# Patient Record
Sex: Female | Born: 1945 | Race: White | Hispanic: No | Marital: Married | Smoking: Former smoker
Health system: Southern US, Community
[De-identification: ages and names within clinical notes are randomized; demographics above are authoritative.]

## PROBLEM LIST (undated history)

## (undated) DIAGNOSIS — E119 Type 2 diabetes mellitus without complications: Secondary | ICD-10-CM

## (undated) DIAGNOSIS — L509 Urticaria, unspecified: Secondary | ICD-10-CM

## (undated) HISTORY — PX: ADENOIDECTOMY: SUR15

## (undated) HISTORY — PX: OTHER SURGICAL HISTORY: SHX169

## (undated) HISTORY — DX: Type 2 diabetes mellitus without complications: E11.9

## (undated) HISTORY — PX: CATARACT EXTRACTION, BILATERAL: SHX1313

## (undated) HISTORY — PX: GALLBLADDER SURGERY: SHX652

## (undated) HISTORY — DX: Urticaria, unspecified: L50.9

## (undated) HISTORY — PX: CARPAL TUNNEL RELEASE: SHX101

---

## 2008-07-12 ENCOUNTER — Ambulatory Visit (HOSPITAL_BASED_OUTPATIENT_CLINIC_OR_DEPARTMENT_OTHER): Admission: RE | Admit: 2008-07-12 | Discharge: 2008-07-12 | Payer: Self-pay | Admitting: Family Medicine

## 2008-07-12 ENCOUNTER — Ambulatory Visit: Payer: Self-pay | Admitting: Diagnostic Radiology

## 2008-10-10 ENCOUNTER — Ambulatory Visit: Payer: Self-pay | Admitting: Diagnostic Radiology

## 2008-10-10 ENCOUNTER — Ambulatory Visit (HOSPITAL_BASED_OUTPATIENT_CLINIC_OR_DEPARTMENT_OTHER): Admission: RE | Admit: 2008-10-10 | Discharge: 2008-10-10 | Payer: Self-pay | Admitting: Family Medicine

## 2010-02-24 ENCOUNTER — Emergency Department (HOSPITAL_COMMUNITY): Admission: EM | Admit: 2010-02-24 | Discharge: 2010-02-24 | Payer: Self-pay | Admitting: Family Medicine

## 2012-02-13 IMAGING — CR DG THORACIC SPINE 2V
3 series · 3 of 3 positions shown · non-contrast
Comparison: Chest radiograph on 10/10/2008

CLINICAL DATA: Thoracic back pain.

THORACIC SPINE - 2 VIEW

[view not recorded (1 of 3)]
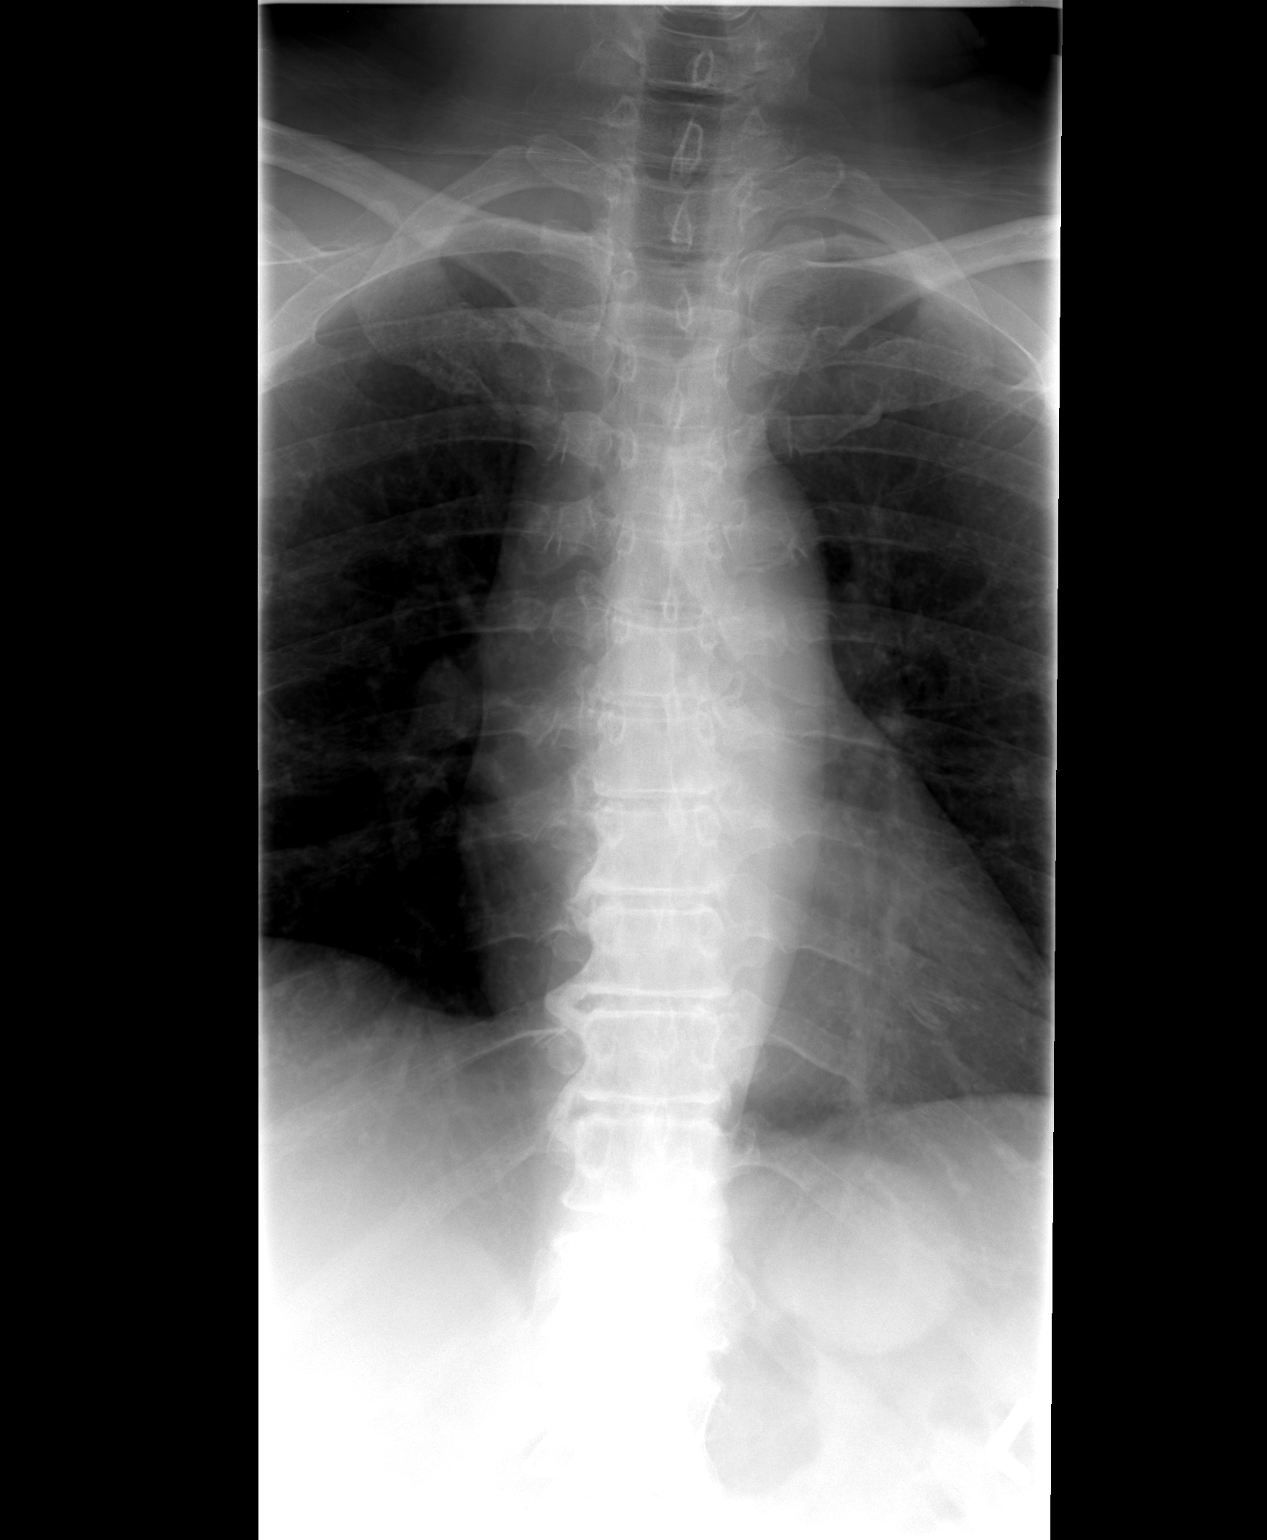

[view not recorded (2 of 3)]
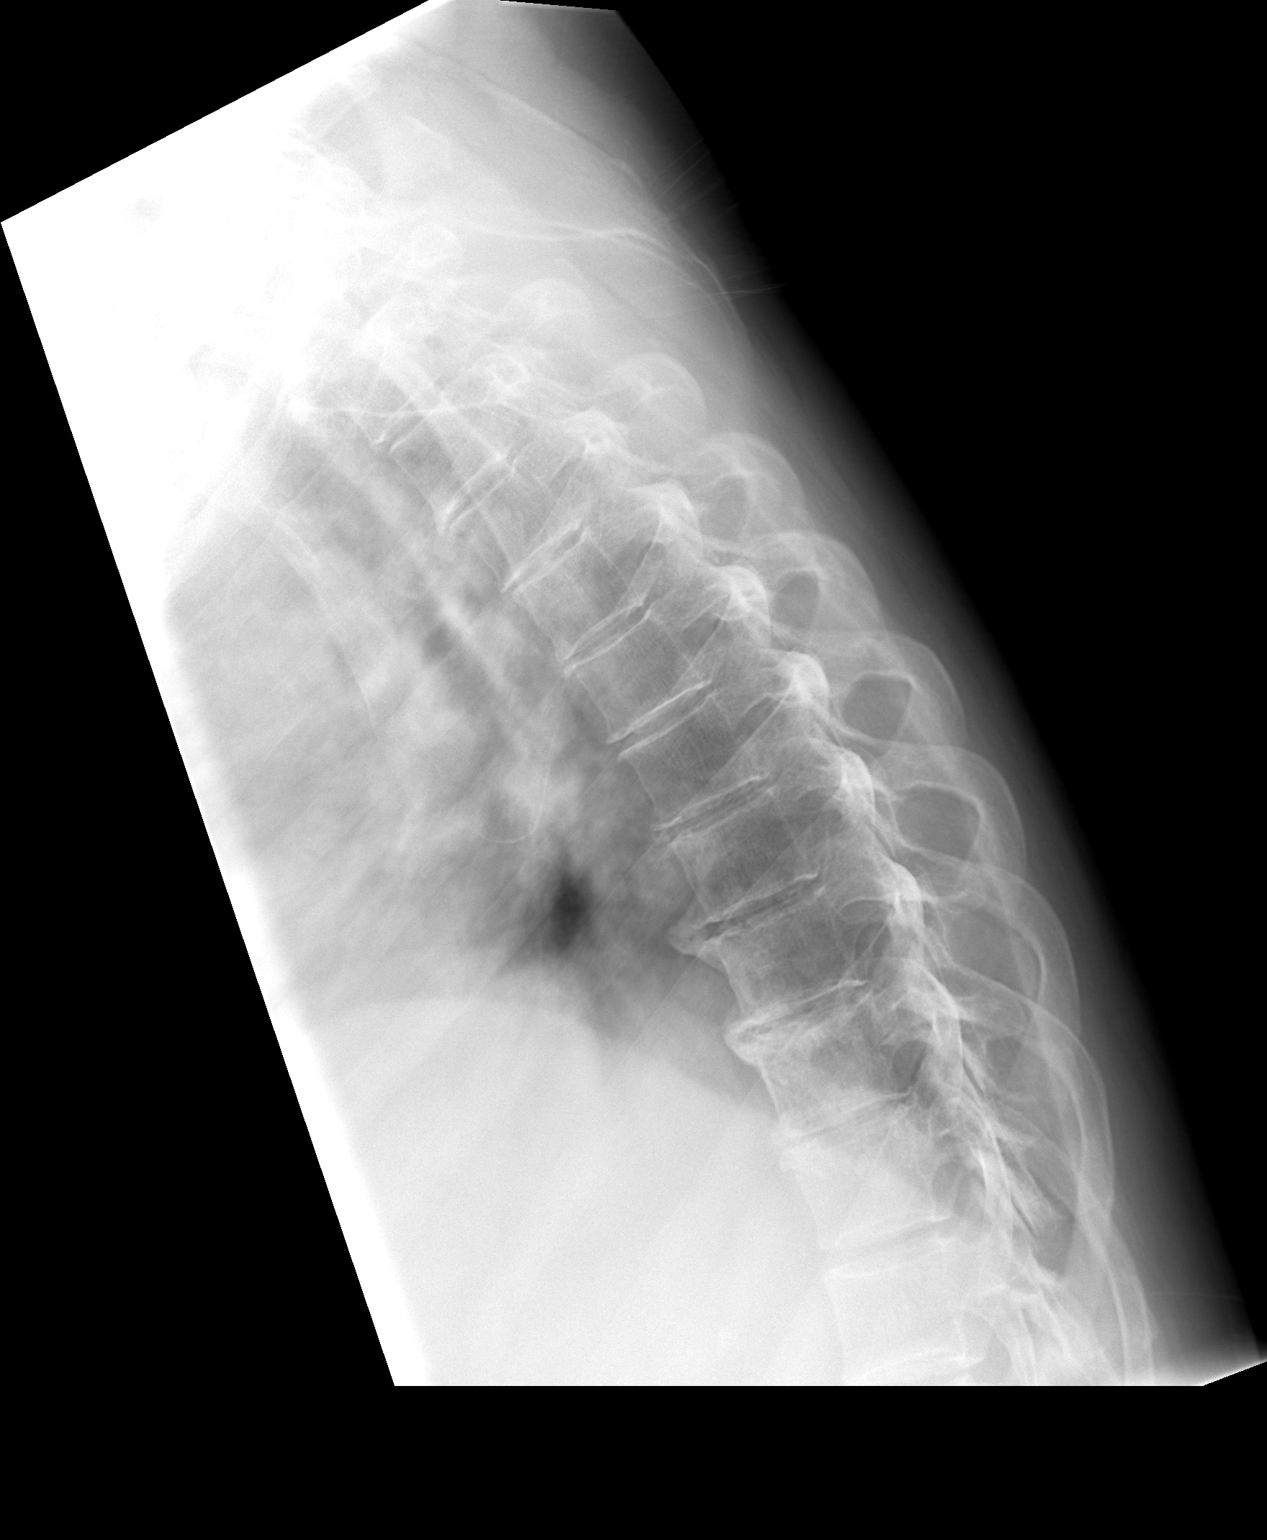

[view not recorded (3 of 3)]
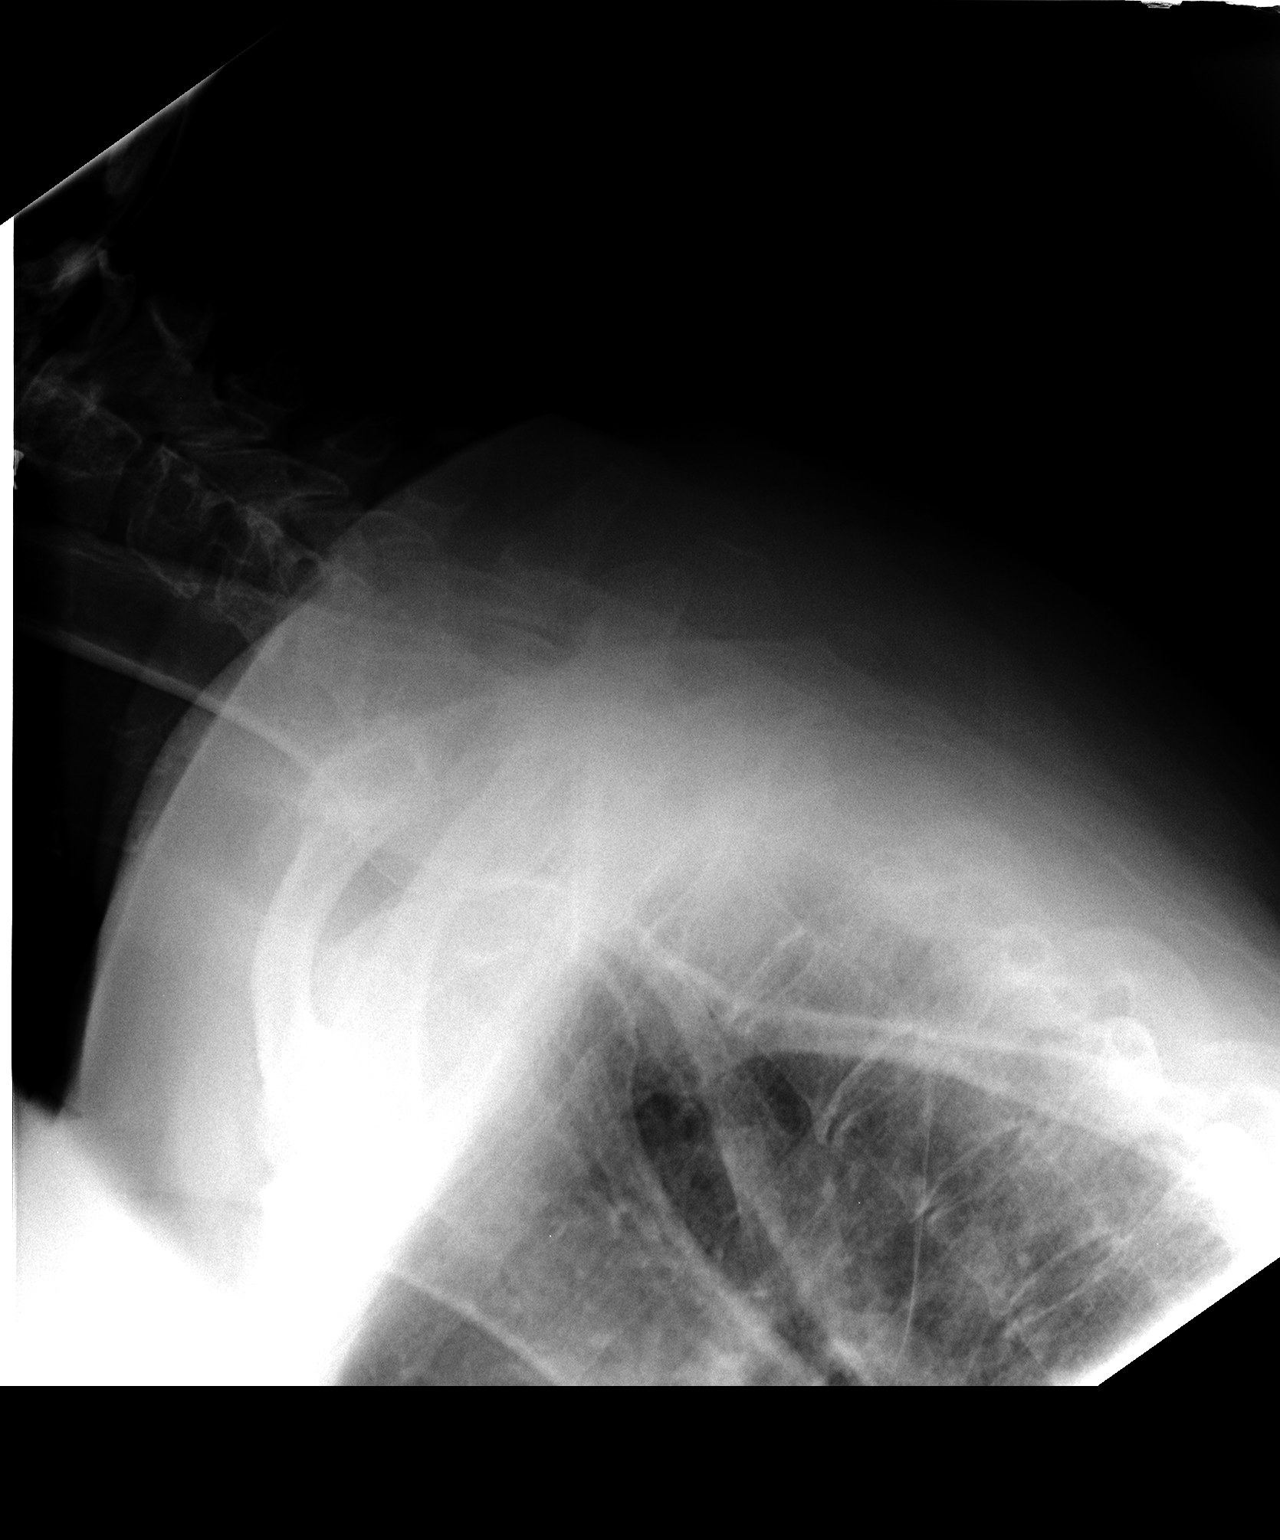

[3 of 3 positions shown; findings below may reference images not displayed]

FINDINGS: No evidence of thoracic spine fracture or subluxation.
Degenerative vertebral endplate osteophyte formation is seen at
several levels in the upper and lower thoracic spine.  No
significant disc space narrowing or endplate destruction.  No focal
lytic or sclerotic bone lesions.
IMPRESSION: 1.  No acute findings.
2.  Mild thoracic degenerative spondylosis.

## 2016-10-08 ENCOUNTER — Encounter: Payer: Self-pay | Admitting: Allergy and Immunology

## 2016-10-08 ENCOUNTER — Ambulatory Visit (INDEPENDENT_AMBULATORY_CARE_PROVIDER_SITE_OTHER): Payer: Medicare Other | Admitting: Allergy and Immunology

## 2016-10-08 VITALS — BP 138/72 | Ht 62.5 in | Wt 178.8 lb

## 2016-10-08 DIAGNOSIS — J3089 Other allergic rhinitis: Secondary | ICD-10-CM | POA: Insufficient documentation

## 2016-10-08 DIAGNOSIS — T7840XA Allergy, unspecified, initial encounter: Secondary | ICD-10-CM

## 2016-10-08 DIAGNOSIS — J310 Chronic rhinitis: Secondary | ICD-10-CM

## 2016-10-08 DIAGNOSIS — J4530 Mild persistent asthma, uncomplicated: Secondary | ICD-10-CM | POA: Diagnosis not present

## 2016-10-08 DIAGNOSIS — L502 Urticaria due to cold and heat: Secondary | ICD-10-CM | POA: Diagnosis not present

## 2016-10-08 MED ORDER — EPINEPHRINE 0.3 MG/0.3ML IJ SOAJ: 0.3000 mg | Freq: Once | INTRAMUSCULAR | 1 refills | Status: AC

## 2016-10-08 NOTE — Progress Notes (Signed)
New Patient Note  RE:  C[place]ack MRN: [id number] DOB: 03/18/1946 Date of Office Visit: 10/08/2016  Referring provider: Christ Kick, PA Primary care provider: Christ Kick, PA  Chief Complaint: Urticaria and Nasal Congestion   History of present illness: Martha Young is a 71 y.o. female seen today in consultation requested by Yevonne Pax, PA.  Starting this past fall, the patient has developed red, itching, burning skin and exposed area when the weather is cool or cold.  On occasion she has noticed hives in these exposed areas.  The symptoms resolved with rewarming.  She is unable to hold a cold beverage in her hand without the palm of her hand becoming red and pruritic.  She does not experience concomitant angioedema, cardiopulmonary symptoms, or GI symptoms.  No other food or environmental triggers have been identified.  Martha Young also complains of persistent rhinorrhea, nasal congestion, postnasal drainage, and occasional sinus pressure.   No significant seasonal symptom variation has been noted nor have specific environmental triggers been identified.She does not seem to experience relief from cetirizine or levocetirizine.  She takes montelukast and albuterol as needed for coughing or wheezing.   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  To rule out etiologies underlying secondary acquired cold urticaria or autoimmune involvement, the following labs have been ordered: CBC with differential, CMP, LFTs, tryptase, thyroglobulin antibody, thyroid peroxidase antibody, chronic urticaria index panel.  Take levocetirizine 5 mg before cold exposure.  Take medications as prescribed.  Protect your skin from the cold or sudden changes in temperature. If you're going swimming, dip your hand in the water first and see if you experience a skin reaction.  Avoid ice-cold drinks and food  to prevent swelling of your lips or throat.  Keep an epinephrine autoinjector with you to help prevent serious reactions.  An epinephrine autoinjector 0.3 mg 2 pack has been prescribed and instructions have been provided for appropriate administration.  If you're scheduled for surgery, talk with your surgeon beforehand about your cold urticaria. The surgical team can take steps to help prevent cold-induced symptoms in the operating room.  Chronic rhinitis We were unable to perform skin tests today due to recent administration of antihistamine.   The patient is scheduled to return in the near future for allergy skin testing after having been off of antihistamines for at least 3 days.  Further recommendations will be made at that time based upon skin test results.  For now, I recommended more consistent use of fluticasone nasal spray.  Mild persistent asthma  Continue montelukast 10 mg daily at bedtime and albuterol HFA, 1-2 inhalations every 4-6 hours as needed.  Subjective and objective measures of pulmonary function will be followed and the treatment plan will be adjusted accordingly.   Meds ordered this encounter  Medications  . EPINEPHrine 0.3 mg/0.3 mL IJ SOAJ injection    Sig: Inject 0.3 mLs (0.3 mg total) into the muscle once.    Dispense:  2 Device    Refill:  1    DISPENSE MYLAN GENERIC ONLY    Diagnostics: Spirometry:  Normal with an FEV1 of 2.37 L.  Please see scanned spirometry results for details. Ice cube test: Positive.    Physical examination: Blood pressure 138/72, height 5' 2.5" (1.588 m), weight 178 lb 12.8 oz (81.1 kg).  General: Alert, interactive, in no acute distress. HEENT: TMs pearly gray, turbinates moderately edematous  with clear discharge, post-pharynx mildly erythematous. Neck: Supple without lymphadenopathy. Lungs: Clear to auscultation without wheezing, rhonchi or rales. CV: Normal S1, S2 without murmurs. Abdomen: Nondistended,  nontender. Skin: Warm and dry, without lesions or rashes. Extremities:  No clubbing, cyanosis or edema. Neuro:   Grossly intact.  Review of systems:  Review of systems negative except as noted in HPI / PMHx or noted below: Review of Systems  Constitutional: Negative.   HENT: Negative.   Eyes: Negative.   Respiratory: Negative.   Cardiovascular: Negative.   Gastrointestinal: Negative.   Genitourinary: Negative.   Musculoskeletal: Negative.   Skin: Negative.   Neurological: Negative.   Endo/Heme/Allergies: Negative.   Psychiatric/Behavioral: Negative.     Past medical history:  Past Medical History:  Diagnosis Date  . Diabetes (HCC)   . Urticaria     Past surgical history:  Past Surgical History:  Procedure Laterality Date  . ADENOIDECTOMY    . CARPAL TUNNEL RELEASE Right   . CATARACT EXTRACTION, BILATERAL    . GALLBLADDER SURGERY    . tubaligation      Family history: Family History  Problem Relation Age of Onset  . Allergic rhinitis Sister   . Angioedema Neg Hx   . Asthma Neg Hx   . Eczema Neg Hx   . Immunodeficiency Neg Hx   . Urticaria Neg Hx     Social history: Social History   Social History  . Marital status: Married    Spouse name: N/A  . Number of children: N/A  . Years of education: N/A   Occupational History  . Not on file.   Social History Main Topics  . Smoking status: Former Smoker    Years: 34.00  . Smokeless tobacco: Never Used  . Alcohol use Yes  . Drug use: No  . Sexual activity: Not on file   Other Topics Concern  . Not on file   Social History Narrative  . No narrative on file   Environmental History: The patient lives in a 69 year old house with carpeting throughout and central air/heat.  There are 5 cats and one dog in the home, the cats have access to her bedroom.  She is a former cigarette smoker having started smoking in 1967 and quit in 2001 with an average of 1.5 packs of cigarettes per day  Allergies as of  10/08/2016      Reactions   Aspirin Anaphylaxis   Passes out every time she takes it    Nsaids       Medication List       Accurate as of 10/08/16  3:19 PM. Always use your most recent med list.          albuterol 108 (90 Base) MCG/ACT inhaler Commonly known as:  PROVENTIL HFA;VENTOLIN HFA Inhale 2 puffs into the lungs every 6 (six) hours as needed.   calcium-vitamin D 500-200 MG-UNIT Tabs tablet Commonly known as:  OSCAL WITH D Take 1 tablet by mouth daily.   EPINEPHrine 0.3 mg/0.3 mL Soaj injection Commonly known as:  EPI-PEN Inject 0.3 mLs (0.3 mg total) into the muscle once.   FLUoxetine 20 MG capsule Commonly known as:  PROZAC Take 20 mg by mouth daily.   fluticasone 50 MCG/ACT nasal spray Commonly known as:  FLONASE Place 2 sprays into the nose daily.   glipiZIDE 5 MG 24 hr tablet Commonly known as:  GLUCOTROL XL Take 10 mg by mouth daily.   ibandronate 150 MG tablet Commonly known as:  BONIVA Take 150 mg by mouth daily.   levocetirizine 5 MG tablet Commonly known as:  XYZAL Take 5 mg by mouth every evening.   lisinopril 10 MG tablet Commonly known as:  PRINIVIL,ZESTRIL Take 10 mg by mouth daily.   Melatonin 10 MG Caps Take 10 mg by mouth daily.   metFORMIN 500 MG 24 hr tablet Commonly known as:  GLUCOPHAGE-XR Take 2,000 mg by mouth daily.   montelukast 10 MG tablet Commonly known as:  SINGULAIR Take 10 mg by mouth daily.   pravastatin 20 MG tablet Commonly known as:  PRAVACHOL Take 20 mg by mouth daily.   THERA Tabs Take 1 tablet by mouth daily.   Vitamin D3 1000 units Caps Take by mouth.       Known medication allergies: Allergies  Allergen Reactions  . Aspirin Anaphylaxis    Passes out every time she takes it   . Nsaids     I appreciate the opportunity to take part in [facility] care. Please do not hesitate to contact me with questions.  Sincerely,   R. Jorene Guest, MD

## 2016-10-08 NOTE — Assessment & Plan Note (Signed)
The patient's history suggests cold urticaria and positive ice cube test confirms this diagnosis.  To rule out etiologies underlying secondary acquired cold urticaria or autoimmune involvement, the following labs have been ordered: CBC with differential, CMP, LFTs, tryptase, thyroglobulin antibody, thyroid peroxidase antibody, chronic urticaria index panel.  Take levocetirizine 5 mg before cold exposure.  Take medications as prescribed.  Protect your skin from the cold or sudden changes in temperature. If you're going swimming, dip your hand in the water first and see if you experience a skin reaction.  Avoid ice-cold drinks and food to prevent swelling of your lips or throat.  Keep an epinephrine autoinjector with you to help prevent serious reactions.  An epinephrine autoinjector 0.3 mg 2 pack has been prescribed and instructions have been provided for appropriate administration.  If you're scheduled for surgery, talk with your surgeon beforehand about your cold urticaria. The surgical team can take steps to help prevent cold-induced symptoms in the operating room.

## 2016-10-08 NOTE — Patient Instructions (Addendum)
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  To rule out etiologies underlying secondary acquired cold urticaria or autoimmune involvement, the following labs have been ordered: CBC with differential, CMP, LFTs, tryptase, thyroglobulin antibody, thyroid peroxidase antibody, chronic urticaria index panel.  Take levocetirizine 5 mg before cold exposure.  Take medications as prescribed.  Protect your skin from the cold or sudden changes in temperature. If you're going swimming, dip your hand in the water first and see if you experience a skin reaction.  Avoid ice-cold drinks and food to prevent swelling of your lips or throat.  Keep an epinephrine autoinjector with you to help prevent serious reactions.  An epinephrine autoinjector 0.3 mg 2 pack has been prescribed and instructions have been provided for appropriate administration.  If you're scheduled for surgery, talk with your surgeon beforehand about your cold urticaria. The surgical team can take steps to help prevent cold-induced symptoms in the operating room.  Chronic rhinitis We were unable to perform skin tests today due to recent administration of antihistamine.   The patient is scheduled to return in the near future for allergy skin testing after having been off of antihistamines for at least 3 days.  Further recommendations will be made at that time based upon skin test results.  For now, I recommended more consistent use of fluticasone nasal spray.  Mild persistent asthma  Continue montelukast 10 mg daily at bedtime and albuterol HFA, 1-2 inhalations every 4-6 hours as needed.  Subjective and objective measures of pulmonary function will be followed and the treatment plan will be adjusted accordingly.   Return for allergy skin testing.   The following tips may help prevent a recurrent episode of cold urticaria:   Take an over-the-counter  antihistamine before cold exposure.  Take medications as prescribed.  Protect your skin from the cold or sudden changes in temperature. If you're going swimming, dip your hand in the water first and see if you experience a skin reaction.  Avoid ice-cold drinks and food to prevent swelling of your lips or throat.  Keep an epinephrine autoinjector (EpiPen, Auvi-Q, others) with you to help prevent serious reactions.  If you're scheduled for surgery, talk with your surgeon beforehand about your cold urticaria. The surgical team can take steps to help prevent cold-induced symptoms in the operating room.  By [facility] Staff

## 2016-10-08 NOTE — Assessment & Plan Note (Signed)
   Continue montelukast 10 mg daily at bedtime and albuterol HFA, 1-2 inhalations every 4-6 hours as needed.  Subjective and objective measures of pulmonary function will be followed and the treatment plan will be adjusted accordingly. 

## 2016-10-08 NOTE — Assessment & Plan Note (Addendum)
We were unable to perform skin tests today due to recent administration of antihistamine.   The patient is scheduled to return in the near future for allergy skin testing after having been off of antihistamines for at least 3 days.  Further recommendations will be made at that time based upon skin test results.  For now, I recommended more consistent use of fluticasone nasal spray.

## 2016-10-16 LAB — CBC WITH DIFFERENTIAL/PLATELET
Basophils Absolute: 0 10*3/uL (ref 0.0–0.2)
Basos: 0 %
EOS (ABSOLUTE): 0.1 10*3/uL (ref 0.0–0.4)
Eos: 2 %
Hematocrit: 39.5 % (ref 34.0–46.6)
Hemoglobin: 13.2 g/dL (ref 11.1–15.9)
Immature Grans (Abs): 0 10*3/uL (ref 0.0–0.1)
Immature Granulocytes: 0 %
Lymphocytes Absolute: 3.9 10*3/uL — ABNORMAL HIGH (ref 0.7–3.1)
Lymphs: 51 %
MCH: 30.9 pg (ref 26.6–33.0)
MCHC: 33.4 g/dL (ref 31.5–35.7)
MCV: 93 fL (ref 79–97)
Monocytes Absolute: 0.4 10*3/uL (ref 0.1–0.9)
Monocytes: 5 %
Neutrophils Absolute: 3.2 10*3/uL (ref 1.4–7.0)
Neutrophils: 42 %
Platelets: 151 10*3/uL (ref 150–379)
RBC: 4.27 x10E6/uL (ref 3.77–5.28)
RDW: 13.2 % (ref 12.3–15.4)
WBC: 7.6 10*3/uL (ref 3.4–10.8)

## 2016-10-16 LAB — CHRONIC URTICARIA: cu index: <1 (ref <10)

## 2016-10-16 LAB — TRYPTASE: Tryptase: 8.1 ug/L (ref 2.2–13.2)

## 2016-10-16 NOTE — Addendum Note (Signed)
Addended by: Mliss Fritz I on: 10/16/2016 09:56 AM   Modules accepted: Orders

## 2016-10-17 LAB — THYROGLOBULIN ANTIBODY: Thyroglobulin Antibody: <1 IU/mL (ref 0.0–0.9)

## 2016-10-17 LAB — SPECIMEN STATUS REPORT

## 2016-10-17 LAB — THYROID PEROXIDASE ANTIBODY: Thyroperoxidase Ab SerPl-aCnc: 14 IU/mL (ref 0–34)

## 2016-11-12 ENCOUNTER — Ambulatory Visit: Payer: Medicare Other | Admitting: Allergy and Immunology

## 2016-11-28 ENCOUNTER — Telehealth: Payer: Self-pay | Admitting: Allergy and Immunology

## 2016-11-28 NOTE — Telephone Encounter (Signed)
[facility] for Dr. Nunzio CobbsBobbitt to review.

## 2016-11-28 NOTE — Telephone Encounter (Signed)
Labs are being faxed. Dr. Nunzio CobbsBobbitt will have to be reviewed.

## 2016-11-28 NOTE — Telephone Encounter (Signed)
Patient called and said she was seen a month ago and had labs run and has not heard anything back yet.

## 2016-12-16 ENCOUNTER — Ambulatory Visit (INDEPENDENT_AMBULATORY_CARE_PROVIDER_SITE_OTHER): Payer: Medicare Other | Admitting: Allergy and Immunology

## 2016-12-16 ENCOUNTER — Encounter: Payer: Self-pay | Admitting: Allergy and Immunology

## 2016-12-16 VITALS — BP 122/72 | HR 72 | Resp 16

## 2016-12-16 DIAGNOSIS — L502 Urticaria due to cold and heat: Secondary | ICD-10-CM | POA: Diagnosis not present

## 2016-12-16 DIAGNOSIS — J4530 Mild persistent asthma, uncomplicated: Secondary | ICD-10-CM

## 2016-12-16 DIAGNOSIS — J3089 Other allergic rhinitis: Secondary | ICD-10-CM | POA: Diagnosis not present

## 2016-12-16 NOTE — Assessment & Plan Note (Signed)
   Aeroallergen avoidance measures have been discussed and provided in written form.  Continue fluticasone nasal spray, 1-2 sprays per nostril daily as needed.  I have also recommended nasal saline spray (i.e., Simply Saline) or nasal saline lavage (i.e., NeilMed) as needed and prior to medicated nasal sprays.  Consider switching to fexofenadine if benefit from levocetirizine seems to diminish with daily use.

## 2016-12-16 NOTE — Assessment & Plan Note (Signed)
   Take Allegra 180 mg before cold exposure.  If antihistamines are required on a daily basis, after 2 or 3 months switch to levocetirizine (Xyzal) and rotate these antihistamines every couple months.  Protect your skin from the cold or sudden changes in temperature. If you're going swimming, dip your hand in the water first and see if you experience a skin reaction.  Avoid ice-cold drinks and food to prevent swelling of your lips or throat.  Keep an epinephrine autoinjector with you to help prevent serious reactions.  An epinephrine autoinjector 0.3 mg 2 pack has been prescribed and instructions have been provided for appropriate administration.  If you're scheduled for surgery, talk with your surgeon beforehand about your cold urticaria. The surgical team can take steps to help prevent cold-induced symptoms in the operating room.

## 2016-12-16 NOTE — Progress Notes (Signed)
Follow-up Note  RE: Martha Young MRN: [id number] DOB: Jun 07, 1946 Date of Office Visit: 12/16/2016  Primary care provider: Christ Kick, PA Referring provider: Christ Kick, PA  History of present illness: Martha Young is a 71 y.o. female presenting today for allergy skin testing.  She is previously seen in this clinic for her initial evaluation on 10/08/2016.  Were unable to proceed with allergy skin testing at that time due to recent administration of antihistamine.  She has been off for antihistamine for the past week in anticipation of today's testing and has noticed increased redness, itching of her skin with pressure and with cold exposure.  In addition, she experiences persistent nasal allergy symptoms.  These symptoms occur year around, however she believes that become more frequent and severe when her [place] pear tree blooms in the springtime.  She has no asthma related complaints today.   Assessment and plan: Perennial allergic rhinitis  Aeroallergen avoidance measures have been discussed and provided in written form.  Continue fluticasone nasal spray, 1-2 sprays per nostril daily as needed.  I have also recommended nasal saline spray (i.e., Simply Saline) or nasal saline lavage (i.e., NeilMed) as needed and prior to medicated nasal sprays.  Consider switching to fexofenadine if benefit from levocetirizine seems to diminish with daily use.  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  If antihistamines are required on a daily basis, after 2 or 3 months switch to levocetirizine (Xyzal) and rotate these antihistamines every couple months.  Protect your skin from the cold or sudden changes in temperature. If you're going swimming, dip your hand in the water first and see if you experience a skin reaction.  Avoid ice-cold drinks and food to prevent swelling of your lips or throat.  Keep an epinephrine autoinjector with you to help prevent serious  reactions.  An epinephrine autoinjector 0.3 mg 2 pack has been prescribed and instructions have been provided for appropriate administration.  If you're scheduled for surgery, talk with your surgeon beforehand about your cold urticaria. The surgical team can take steps to help prevent cold-induced symptoms in the operating room.  Mild persistent asthma  Continue montelukast 10 mg daily at bedtime and albuterol HFA, 1-2 inhalations every 4-6 hours as needed.   Diagnostics: Spirometry:  Normal with an FEV1 of 109% predicted.  Please see scanned spirometry results for details.    Physical examination: Blood pressure 122/72, pulse 72, resp. rate 16.  General: Alert, interactive, in no acute distress. HEENT: TMs pearly gray, turbinates mildly edematous without discharge, post-pharynx mildly erythematous. Neck: Supple without lymphadenopathy. Lungs: Clear to auscultation without wheezing, rhonchi or rales. CV: Normal S1, S2 without murmurs. Skin: Warm and dry, without lesions or rashes.  The following portions of the patient's history were reviewed and updated as appropriate: allergies, current medications, past family history, past medical history, past social history, past surgical history and problem list.  Allergies as of 12/16/2016      Reactions   Aspirin Anaphylaxis   Passes out every time she takes it    Nsaids       Medication List       Accurate as of 12/16/16 12:56 PM. Always use your most recent med list.          albuterol 108 (90 Base) MCG/ACT inhaler Commonly known as:  PROVENTIL HFA;VENTOLIN HFA Inhale 2 puffs into the lungs every 6 (six) hours as needed.   calcium-vitamin D 500-200 MG-UNIT Tabs  tablet Commonly known as:  OSCAL WITH D Take 1 tablet by mouth daily.   FLUoxetine 20 MG capsule Commonly known as:  PROZAC Take 20 mg by mouth daily.   fluticasone 50 MCG/ACT nasal spray Commonly known as:  FLONASE Place 2 sprays into the nose daily.     glipiZIDE 5 MG 24 hr tablet Commonly known as:  GLUCOTROL XL Take 10 mg by mouth daily.   ibandronate 150 MG tablet Commonly known as:  BONIVA Take 150 mg by mouth daily.   levocetirizine 5 MG tablet Commonly known as:  XYZAL Take 5 mg by mouth every evening.   lisinopril 10 MG tablet Commonly known as:  PRINIVIL,ZESTRIL Take 10 mg by mouth daily.   Melatonin 10 MG Caps Take 10 mg by mouth daily.   metFORMIN 500 MG 24 hr tablet Commonly known as:  GLUCOPHAGE-XR Take 2,000 mg by mouth daily.   montelukast 10 MG tablet Commonly known as:  SINGULAIR Take 10 mg by mouth daily.   pravastatin 20 MG tablet Commonly known as:  PRAVACHOL Take 20 mg by mouth daily.   THERA Tabs Take 1 tablet by mouth daily.   Vitamin D3 1000 units Caps Take by mouth.       Allergies  Allergen Reactions  . Aspirin Anaphylaxis    Passes out every time she takes it   . Nsaids    Review of systems: Review of systems negative except as noted in HPI / PMHx or noted below: Constitutional: Negative.  HENT: Negative.   Eyes: Negative.  Respiratory: Negative.   Cardiovascular: Negative.  Gastrointestinal: Negative.  Genitourinary: Negative.  Musculoskeletal: Negative.  Neurological: Negative.  Endo/Heme/Allergies: Negative.  Cutaneous: Negative.  Past Medical History:  Diagnosis Date  . Diabetes (HCC)   . Urticaria     Family History  Problem Relation Age of Onset  . Allergic rhinitis Sister   . Angioedema Neg Hx   . Asthma Neg Hx   . Eczema Neg Hx   . Immunodeficiency Neg Hx   . Urticaria Neg Hx     Social History   Social History  . Marital status: Married    Spouse name: N/A  . Number of children: N/A  . Years of education: N/A   Occupational History  . Not on file.   Social History Main Topics  . Smoking status: Former Smoker    Years: 34.00  . Smokeless tobacco: Never Used  . Alcohol use Yes  . Drug use: No  . Sexual activity: Not on file   Other  Topics Concern  . Not on file   Social History Narrative  . No narrative on file    [facility]. Please do not hesitate to contact me with questions.  Sincerely,   R. Jorene Guestarter Darneisha Windhorst, MD

## 2016-12-16 NOTE — Assessment & Plan Note (Signed)
   Continue montelukast 10 mg daily at bedtime and albuterol HFA, 1-2 inhalations every 4-6 hours as needed.

## 2016-12-16 NOTE — Patient Instructions (Addendum)
Perennial allergic rhinitis  Aeroallergen avoidance measures have been discussed and provided in written form.  Continue fluticasone nasal spray, 1-2 sprays per nostril daily as needed.  I have also recommended nasal saline spray (i.e., Simply Saline) or nasal saline lavage (i.e., NeilMed) as needed and prior to medicated nasal sprays.  Consider switching to fexofenadine if benefit from levocetirizine seems to diminish with daily use.  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  If antihistamines are required on a daily basis, after 2 or 3 months switch to levocetirizine (Xyzal) and rotate these antihistamines every couple months.  Protect your skin from the cold or sudden changes in temperature. If you're going swimming, dip your hand in the water first and see if you experience a skin reaction.  Avoid ice-cold drinks and food to prevent swelling of your lips or throat.  Keep an epinephrine autoinjector with you to help prevent serious reactions.  An epinephrine autoinjector 0.3 mg 2 pack has been prescribed and instructions have been provided for appropriate administration.  If you're scheduled for surgery, talk with your surgeon beforehand about your cold urticaria. The surgical team can take steps to help prevent cold-induced symptoms in the operating room.  Mild persistent asthma  Continue montelukast 10 mg daily at bedtime and albuterol HFA, 1-2 inhalations every 4-6 hours as needed.   Return in about 1 year (around 12/16/2017), or if symptoms worsen or fail to improve.  Control of Mold Allergen  Mold and fungi can grow on a variety of surfaces provided certain temperature and moisture conditions exist.  Outdoor molds grow on plants, decaying vegetation and soil.  The major outdoor mold, Alternaria and Cladosporium, are found in very high numbers during hot and dry conditions.  Generally, a late Summer - Fall peak is seen for common outdoor fungal spores.   Rain will temporarily lower outdoor mold spore count, but counts rise rapidly when the rainy period ends.  The most important indoor molds are Aspergillus and Penicillium.  Dark, humid and poorly ventilated basements are ideal sites for mold growth.  The next most common sites of mold growth are the bathroom and the kitchen.  Outdoor MicrosoftMold Control 1. Use air conditioning and keep windows closed 2. Avoid exposure to decaying vegetation. 3. Avoid leaf raking. 4. Avoid grain handling. 5. Consider wearing a face mask if working in moldy areas.  Indoor Mold Control 1. Maintain humidity below 50%. 2. Clean washable surfaces with 5% bleach solution. 3. Remove sources e.g. Contaminated carpets.  Control of Cockroach Allergen  Cockroach allergen has been identified as an important cause of acute attacks of asthma, especially in urban settings.  There are fifty-five species of cockroach that exist in the [place], however only three, the [place], [place] and Oriental species produce allergen that can affect patients with Asthma.  Allergens can be obtained from fecal particles, egg casings and secretions from cockroaches.    1. Remove food sources. 2. Reduce access to water. 3. Seal access and entry points. 4. Spray runways with 0.5-1% Diazinon or Chlorpyrifos 5. Blow boric acid power under stoves and refrigerator. 6. Place bait stations (hydramethylnon) at feeding sites.  Control of House Dust Mite Allergen  House dust mites play a major role in allergic asthma and rhinitis.  They occur in environments with high humidity wherever human skin, the food for dust mites is found. High levels have been detected in dust obtained from mattresses, pillows, carpets, upholstered furniture, bed covers, clothes  and soft toys.  The principal allergen of the house dust mite is found in its feces.  A gram of dust may contain 1,000 mites and 250,000 fecal particles.  Mite antigen is easily measured in the air  during house cleaning activities.    1. Encase mattresses, including the box spring, and pillow, in an air tight cover.  Seal the zipper end of the encased mattresses with wide adhesive tape. 2. Wash the bedding in water of 130 degrees Farenheit weekly.  Avoid cotton comforters/quilts and flannel bedding: the most ideal bed covering is the dacron comforter. 3. Remove all upholstered furniture from the bedroom. 4. Remove carpets, carpet padding, rugs, and non-washable window drapes from the bedroom.  Wash drapes weekly or use plastic window coverings. 5. Remove all non-washable stuffed toys from the bedroom.  Wash stuffed toys weekly. 6. Have the room cleaned frequently with a vacuum cleaner and a damp dust-mop.  The patient should not be in a room which is being cleaned and should wait 1 hour after cleaning before going into the room. 7. Close and seal all heating outlets in the bedroom.  Otherwise, the room will become filled with dust-laden air.  An electric heater can be used to heat the room. 8. Reduce indoor humidity to less than 50%.  Do not use a humidifier.

## 2017-02-03 ENCOUNTER — Telehealth: Payer: Self-pay | Admitting: Allergy and Immunology

## 2017-02-03 NOTE — Telephone Encounter (Signed)
I spoke with the patient on the telephone.  I discussed with her the Epic log regarding when lab results came in and what I had written in response. She has questions because her insurance did not pay for "analysis of cell function" (code [id number] and [id number]), however I did not order a cell function lab. I told her that you would call her in the morning to help straighten it out. Federico FlakeKathy Trivette may need to help as well. (She is not angry anymore). Thanks.

## 2017-02-03 NOTE — Telephone Encounter (Signed)
Patient called and stated she was upset with our practice. She saw you on April 3, had blood work done at [facility] on April 4. She called twice about blood results because she had a scheduled appointment on May 8 for skin testing. She rescheduled that appointment for June 11 to give time for the test results to come in. She came to the appointment and said she saw you and you apologized for her not being called back about these results and said you would write a letter of apology and give the test results. She has not received anything and now Medicare is denying her blood work from [facility]. She would like to speak to you directly.

## 2017-02-05 NOTE — Telephone Encounter (Signed)
Called and left message for patient to call me in the [facility] office and discuss her [facility] bill. I will help her determine who ordered the tests and how we can get it straightened out

## 2017-02-11 NOTE — Telephone Encounter (Signed)
Called patient and left another message. I explained that I am happy to help with any bill or concerns she may have. I left information for a call back. I repeated the information and the phone number.

## 2017-10-13 ENCOUNTER — Encounter: Payer: Self-pay | Admitting: Emergency Medicine

## 2017-10-13 ENCOUNTER — Emergency Department (INDEPENDENT_AMBULATORY_CARE_PROVIDER_SITE_OTHER): Payer: Medicare Other

## 2017-10-13 ENCOUNTER — Emergency Department (INDEPENDENT_AMBULATORY_CARE_PROVIDER_SITE_OTHER)
Admission: EM | Admit: 2017-10-13 | Discharge: 2017-10-13 | Disposition: A | Discharge status: Home / Self Care | Payer: Medicare Other | Source: Home / Self Care

## 2017-10-13 DIAGNOSIS — X58XXXA Exposure to other specified factors, initial encounter: Secondary | ICD-10-CM

## 2017-10-13 DIAGNOSIS — S2232XA Fracture of one rib, left side, initial encounter for closed fracture: Secondary | ICD-10-CM | POA: Diagnosis not present

## 2017-10-13 NOTE — ED Triage Notes (Signed)
Pt c/o left sided rib pain after bending over x4 days ago.

## 2017-10-13 NOTE — Discharge Instructions (Addendum)
Return if any problems.  See your Physicain for recheck in 1 week  

## 2017-10-16 NOTE — ED Provider Notes (Signed)
[facility]    CSN: [id number] Arrival date & time: 10/13/17  1303     History   Chief Complaint Chief Complaint  Patient presents with  . Rib Injury    HPI Martha Young is a 72 y.o. female.   The history is provided by the patient. No language interpreter was used.  Chest Pain  Pain location:  L lateral chest Pain quality: aching   Pain radiates to:  Does not radiate Pain severity:  Moderate Onset quality:  Gradual Duration:  4 days Timing:  Constant Progression:  Worsening Chronicity:  New Worsened by:  Nothing Ineffective treatments:  None tried Associated symptoms: no weakness   Pt reports she was bending over and heard a pop in her chest.  Pt has had pain since.  She has a previous rib fracture   Past Medical History:  Diagnosis Date  . Diabetes (HCC)   . Urticaria     Patient Active Problem List   Diagnosis Date Noted  . 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 Mild persistent asthma 10/08/2016  . Perennial allergic rhinitis 10/08/2016  . Allergic reaction 10/08/2016    Past Surgical History:  Procedure Laterality Date  . ADENOIDECTOMY    . CARPAL TUNNEL RELEASE Right   . CATARACT EXTRACTION, BILATERAL    . GALLBLADDER SURGERY    . tubaligation      OB History   None      Home Medications    Prior to Admission medications   Medication Sig Start Date End Date Taking? Authorizing Provider  albuterol (PROVENTIL HFA;VENTOLIN HFA) 108 (90 Base) MCG/ACT inhaler Inhale 2 puffs into the lungs every 6 (six) hours as needed. 11/10/15   [provider]  calcium-vitamin D (OSCAL WITH D) 500-200 MG-UNIT TABS tablet Take 1 tablet by mouth daily.     [provider]  Cholecalciferol (VITAMIN D3) 1000 units CAPS Take by mouth.    [provider]  FLUoxetine (PROZAC) 20 MG capsule Take 20 mg by mouth daily. 06/17/16   [provider]  fluticasone (FLONASE) 50 MCG/ACT nasal spray Place 2 sprays into the nose daily.     [provider]  glipiZIDE (GLUCOTROL XL) 5 MG 24 hr tablet Take 10 mg by mouth daily. 09/16/16   [provider]  ibandronate (BONIVA) 150 MG tablet Take 150 mg by mouth daily. 10/24/15   [provider]  levocetirizine (XYZAL) 5 MG tablet Take 5 mg by mouth every evening.    [provider]  lisinopril (PRINIVIL,ZESTRIL) 10 MG tablet Take 10 mg by mouth daily. 11/13/15   [provider]  Melatonin 10 MG CAPS Take 10 mg by mouth daily.    [provider]  metFORMIN (GLUCOPHAGE-XR) 500 MG 24 hr tablet Take 2,000 mg by mouth daily. 05/16/15   [provider]  montelukast (SINGULAIR) 10 MG tablet Take 10 mg by mouth daily. 06/17/16   [provider]  Multiple Vitamin (THERA) TABS Take 1 tablet by mouth daily.    [provider]  pravastatin (PRAVACHOL) 20 MG tablet Take 20 mg by mouth daily. 06/17/16   [provider]    Family History Family History  Problem Relation Age of Onset  . Allergic rhinitis Sister   . Angioedema Neg Hx   . Asthma Neg Hx   . Eczema Neg Hx   . Immunodeficiency Neg Hx   . Urticaria Neg Hx     Social History Social History   Tobacco  Use  . Smoking status: Former Smoker    Years: 34.00  . Smokeless tobacco: Never Used  Substance Use Topics  . Alcohol use: Yes  . Drug use: No     Allergies   Aspirin and Nsaids   Review of Systems Review of Systems  Cardiovascular: Positive for chest pain.  Neurological: Negative for weakness.  All other systems reviewed and are negative.    Physical Exam Triage Vital Signs ED Triage Vitals  Enc Vitals Group     BP 10/13/17 1346 131/75     Pulse Rate 10/13/17 1346 78     Resp --      Temp 10/13/17 1346 98.5 F (36.9 C)     Temp Source 10/13/17 1346 Oral     SpO2 10/13/17 1346 96 %     Weight 10/13/17 1347 176 lb (79.8 kg)     Height --      Head Circumference --      Peak Flow --      Pain Score 10/13/17 1346 2      Pain Loc --      Pain Edu? --      Excl. in GC? --    No data found.  Updated Vital Signs BP 131/75 (BP Location: Right Arm)   Pulse 78   Temp 98.5 F (36.9 C) (Oral)   Wt 176 lb (79.8 kg)   SpO2 96%   BMI 31.68 kg/m   Visual Acuity Right Eye Distance:   Left Eye Distance:   Bilateral Distance:    Right Eye Near:   Left Eye Near:    Bilateral Near:     Physical Exam  Constitutional: She appears well-developed.  HENT:  Head: Normocephalic.  Left Ear: External ear normal.  Eyes: Pupils are equal, round, and reactive to light.  Cardiovascular: Normal rate.  Pulmonary/Chest: Effort normal.  Tender left lower rib,   Abdominal: Soft.  Nursing note and vitals reviewed.    UC Treatments / Results  Labs (all labs ordered are listed, but only abnormal results are displayed) Labs Reviewed - No data to display  EKG None Radiology No results found.  Procedures Procedures (including critical care time)  Medications Ordered in UC Medications - No data to display   Initial Impression / Assessment and Plan / UC Course  I have reviewed the triage vital signs and the nursing notes.  Pertinent labs & imaging results that were available during my care of the patient were reviewed by me and considered in my medical decision making (see chart for details).     MDM  Pt has a 10th rib fracture,  This may be her old fracture.  Pt counseled on results.  Pt declined pain medication.  Pt advised to see her MD for recheck in 1 week.   Final Clinical Impressions(s) / UC Diagnoses   Final diagnoses:  Closed fracture of distal end of left tibia, unspecified fracture morphology, initial encounter    ED Discharge Orders    None      An After Visit Summary was printed and given to the patient.  Controlled Substance Prescriptions [place] Controlled Substance Registry consulted? Not Applicable   Elson Areas, [place] 10/16/17 1610

## 2019-10-02 IMAGING — DX DG RIBS W/ CHEST 3+V*L*
3 series · 3 of 3 positions shown · non-contrast
Comparison: Chest radiograph October 10, 2008

CLINICAL DATA: Felt a pop in LEFT ribs while gardening.  Ex smoker.

EXAM:
LEFT RIBS AND CHEST - 3+ VIEW

[chest pa]
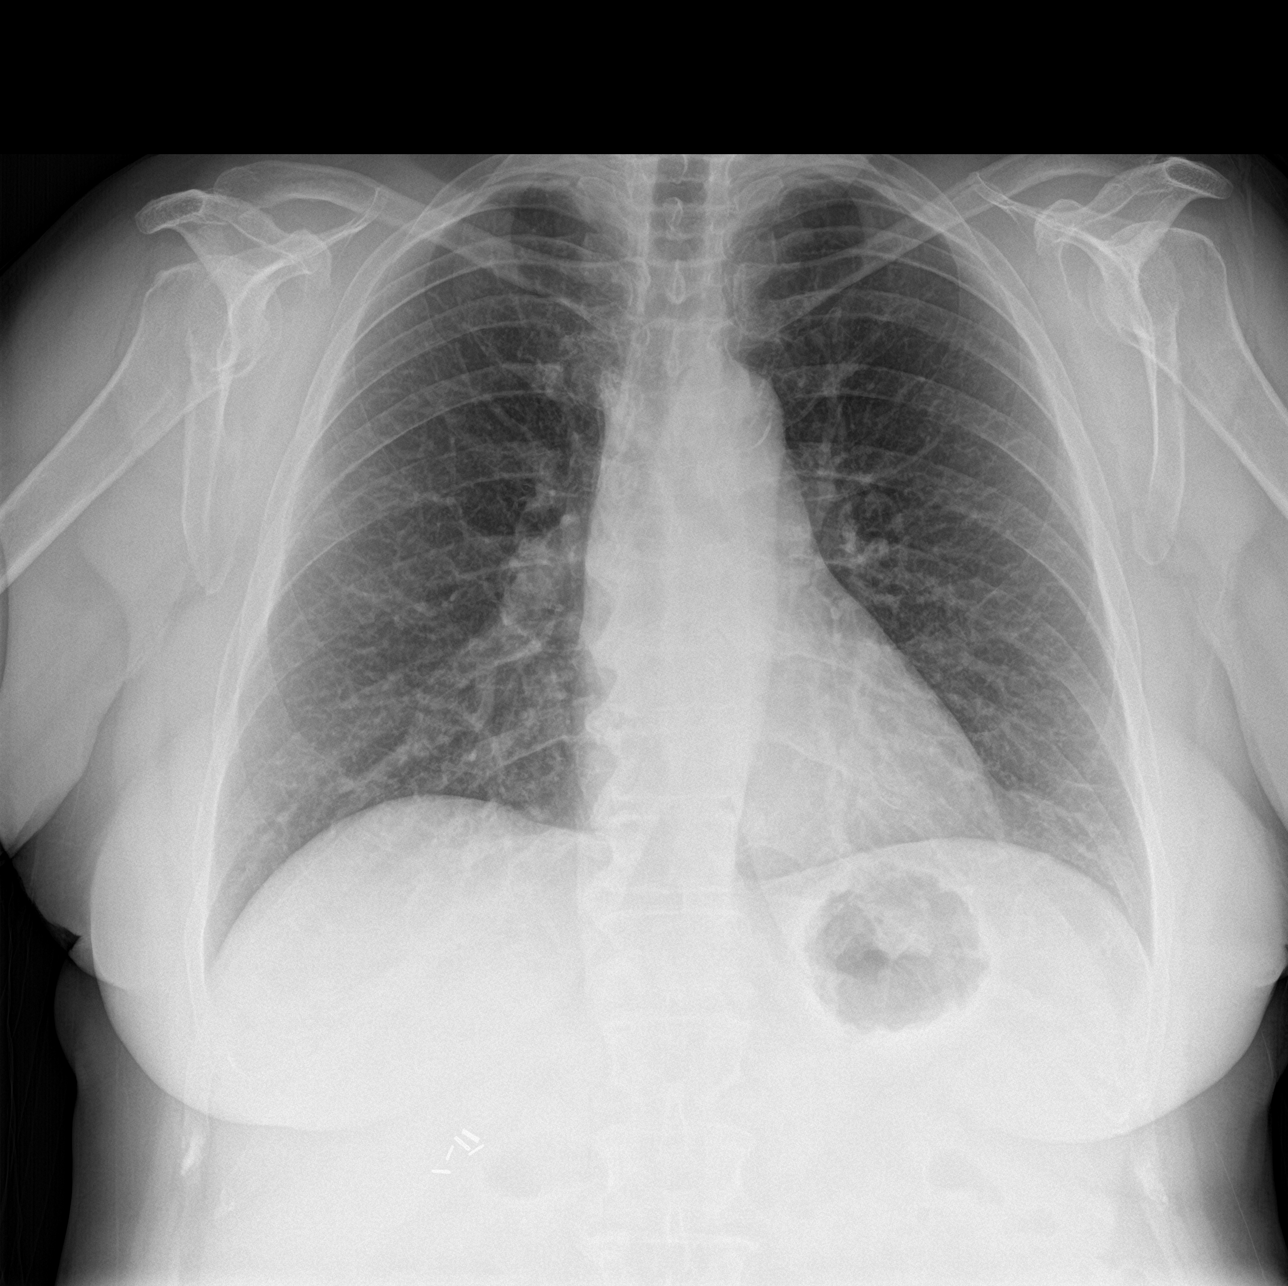

[rib pa]
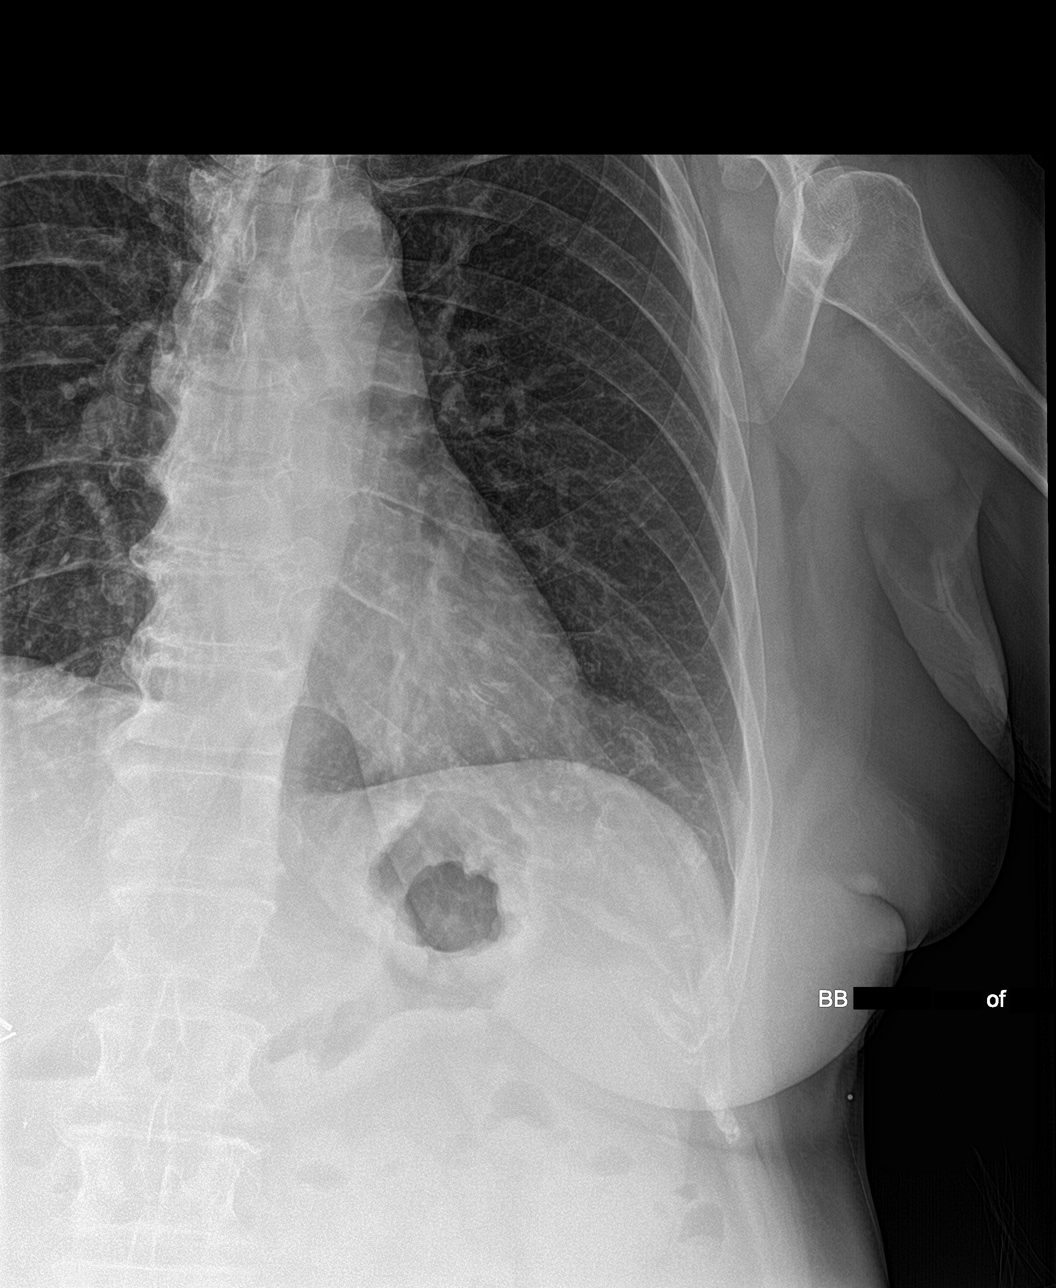

[rib pa obl]
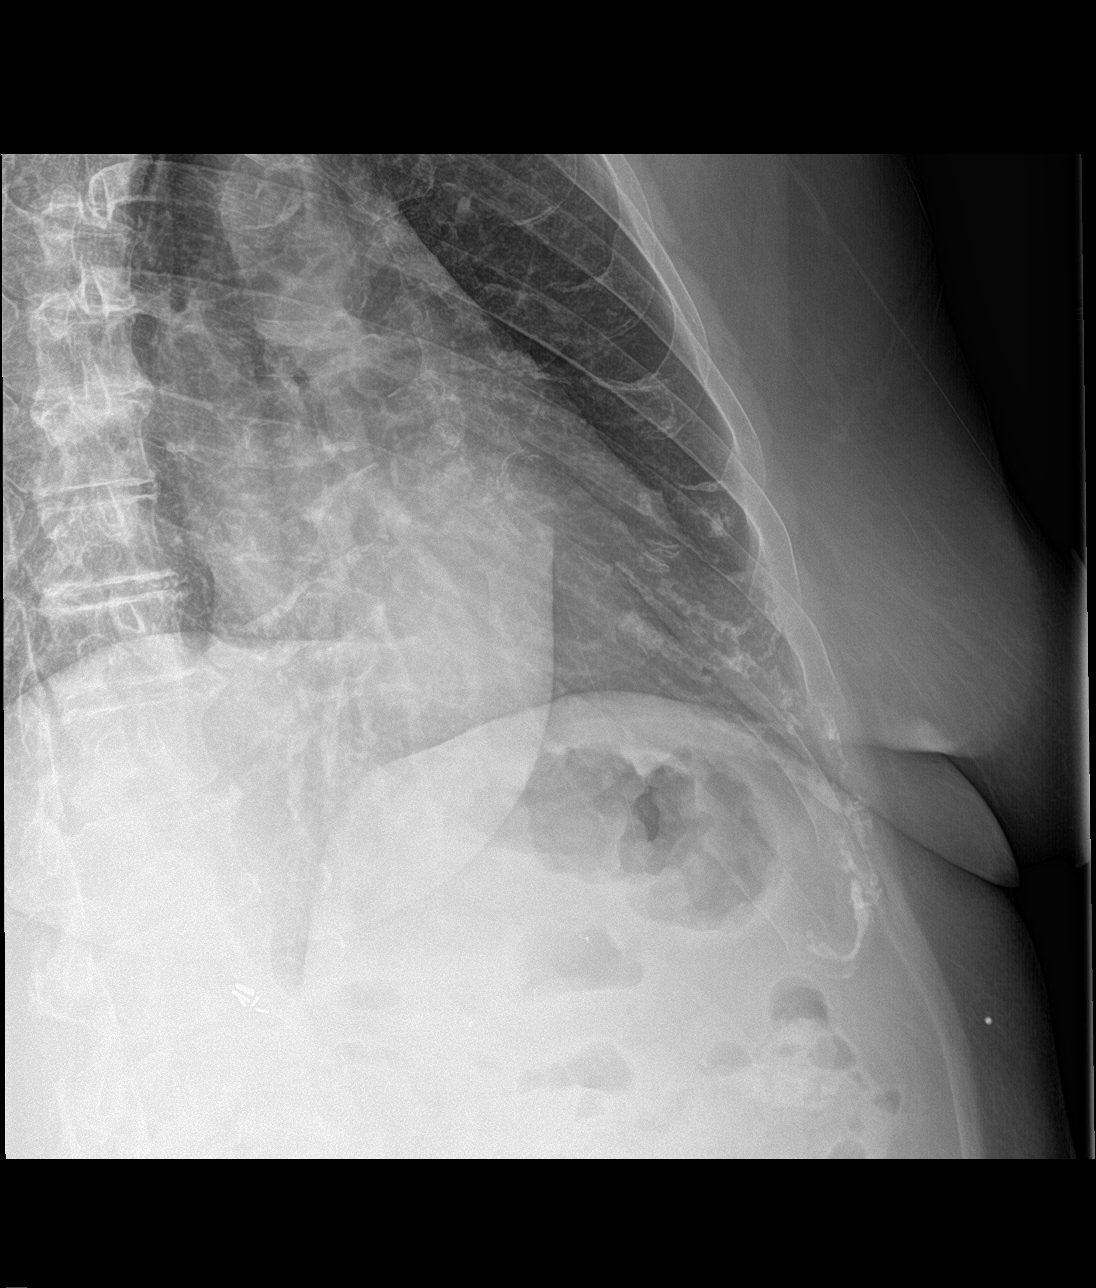

[3 of 3 positions shown; findings below may reference images not displayed]

FINDINGS: No acute fracture or other bone lesions are seen involving the ribs.
Old appearing LEFT anterolateral tenth rib fracture seen on single
view. There is no evidence of pneumothorax or pleural effusion.
Strandy densities LEFT lung base. Diffuse interstitial prominence
without pleural effusion or focal consolidation. Heart size and
mediastinal contours are within normal limits. Calcified aortic
arch. Surgical clips in the included right abdomen compatible with
cholecystectomy. Mild degenerative change of the thoracic spine.
IMPRESSION: Old appearing LEFT tenth rib fracture.

Mild chronic appearing interstitial changes. LEFT lung base
atelectasis/scarring.

## 2021-05-14 ENCOUNTER — Emergency Department (INDEPENDENT_AMBULATORY_CARE_PROVIDER_SITE_OTHER)
Admission: EM | Admit: 2021-05-14 | Discharge: 2021-05-14 | Disposition: A | Discharge status: Home / Self Care | Payer: Medicare Other | Source: Home / Self Care | Attending: Family Medicine | Admitting: Family Medicine

## 2021-05-14 ENCOUNTER — Other Ambulatory Visit: Payer: Self-pay

## 2021-05-14 DIAGNOSIS — J069 Acute upper respiratory infection, unspecified: Secondary | ICD-10-CM

## 2021-05-14 DIAGNOSIS — R102 Pelvic and perineal pain: Secondary | ICD-10-CM | POA: Diagnosis not present

## 2021-05-14 DIAGNOSIS — N390 Urinary tract infection, site not specified: Secondary | ICD-10-CM

## 2021-05-14 LAB — POCT URINALYSIS DIP (MANUAL ENTRY)
Bilirubin, UA: NEGATIVE
Glucose, UA: NEGATIVE mg/dL
Ketones, POC UA: NEGATIVE mg/dL
Nitrite, UA: NEGATIVE
Protein Ur, POC: NEGATIVE mg/dL
Spec Grav, UA: 1.025 (ref 1.010–1.025)
Urobilinogen, UA: 0.2 E.U./dL
pH, UA: 5.5 (ref 5.0–8.0)

## 2021-05-14 MED ORDER — CEPHALEXIN 500 MG PO CAPS: 500.0000 mg | ORAL_CAPSULE | Freq: Two times a day (BID) | ORAL | 0 refills | Status: AC

## 2021-05-14 NOTE — Discharge Instructions (Addendum)
Drink lots of water Take the antibiotic 2 x a day  the urine has been sent for culture May take over-the-counter cough and cold medicines as needed

## 2021-05-14 NOTE — ED Triage Notes (Signed)
Pt presents with c/o bladder pain following a lithotripsy on Friday. Pt states she also received a Hepatitis shot and is now experiencing cough and sore throat.

## 2021-05-14 NOTE — ED Provider Notes (Signed)
[facility]    CSN: [id number] Arrival date & time: 05/14/21  1022      History   Chief Complaint Chief Complaint  Patient presents with   Abdominal Pain    bladder   Cough   Sore Throat    HPI Martha Young is a 75 y.o. female.   HPI Patient states that she had a lithotripsy about a week ago.  She states that she had a lot of "blood and sand" in her urine for a few days.  This seemed to clear up.  She thought she was back to normal.  Since yesterday she has had some pain in her bladder, cloudy urine, blood in her urine.  Mild dysuria.  No frequency.  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      Home Medications    Prior to Admission medications   Medication Sig Start Date End Date Taking? Authorizing Provider  cephALEXin (KEFLEX) 500 MG capsule Take 1 capsule (500 mg total) by mouth 2 (two) times daily. 05/14/21  Yes Eustace Moore, MD  [facility]) Inject into the skin.   Yes [provider]  calcium-vitamin D (OSCAL WITH D) 500-200 MG-UNIT TABS tablet Take 1 tablet by mouth daily.     [provider]  Cholecalciferol (VITAMIN D3) 1000 units CAPS Take by mouth.    [provider]  fluticasone (FLONASE) 50 MCG/ACT nasal spray Place 2 sprays into the nose daily.    [provider]  ibandronate (BONIVA) 150 MG tablet Take 150 mg by mouth daily. 10/24/15   [provider]  levocetirizine (XYZAL) 5 MG  tablet Take 5 mg by mouth every evening.    [provider]  lisinopril (PRINIVIL,ZESTRIL) 10 MG tablet Take 10 mg by mouth daily. 11/13/15   [provider]  Melatonin 10 MG CAPS Take 10 mg by mouth daily.    [provider]  metFORMIN (GLUCOPHAGE-XR) 500 MG 24 hr tablet Take 2,000 mg by mouth daily. 05/16/15   [provider]  montelukast (SINGULAIR) 10 MG tablet Take 10 mg by mouth daily. 06/17/16   [provider]  Multiple Vitamin (THERA) TABS Take 1 tablet by mouth daily.    [provider]  pravastatin (PRAVACHOL) 20 MG tablet Take 20 mg by mouth daily. 06/17/16   [provider]    Family History Family History  Problem Relation Age of Onset   Allergic rhinitis Sister    Angioedema Neg Hx    Asthma Neg Hx    Eczema Neg Hx    Immunodeficiency Neg Hx    Urticaria Neg Hx     Social History Social History   Tobacco Use   Smoking status: Former    Years: 34.00    Types: Cigarettes   Smokeless tobacco: Never  Substance Use Topics   Alcohol use: Yes   Drug use:  No     Allergies   Aspirin and Nsaids   Review of Systems Review of Systems See HPI  Physical Exam Triage Vital Signs ED Triage Vitals  Enc Vitals Group     BP 05/14/21 1048 132/84     Pulse Rate 05/14/21 1048 85     Resp 05/14/21 1048 12     Temp 05/14/21 1048 99.4 F (37.4 C)     Temp Source 05/14/21 1048 Oral     SpO2 05/14/21 1048 97 %     Weight --      Height --      Head Circumference --      Peak Flow --      Pain Score 05/14/21 1050 3     Pain Loc --      Pain Edu? --      Excl. in GC? --    No data found.  Updated Vital Signs BP 132/84 (BP Location: Left Arm)   Pulse 85   Temp 99.4 F (37.4 C) (Oral)   Resp 12   SpO2 97%       Physical Exam Constitutional:      General: She is not in acute distress.    Appearance: She is well-developed.  HENT:     Head: Normocephalic and atraumatic.     Right Ear: Tympanic  membrane, ear canal and external ear normal.     Left Ear: Tympanic membrane, ear canal and external ear normal.     Nose: No congestion.     Mouth/Throat:     Pharynx: No posterior oropharyngeal erythema.  Eyes:     Conjunctiva/sclera: Conjunctivae normal.     Pupils: Pupils are equal, round, and reactive to light.  Cardiovascular:     Rate and Rhythm: Normal rate.  Pulmonary:     Effort: Pulmonary effort is normal. No respiratory distress.  Abdominal:     General: There is no distension.     Palpations: Abdomen is soft.     Tenderness: There is no right CVA tenderness or left CVA tenderness.  Musculoskeletal:        General: Normal range of motion.     Cervical back: Normal range of motion.  Skin:    General: Skin is warm and dry.  Neurological:     Mental Status: She is alert.     UC Treatments / Results  Labs (all labs ordered are listed, but only abnormal results are displayed) Labs Reviewed  POCT URINALYSIS DIP (MANUAL ENTRY) - Abnormal; Notable for the following components:      Result Value   Color, UA straw (*)    Clarity, UA cloudy (*)    Blood, UA moderate (*)    Leukocytes, UA Small (1+) (*)    All other components within normal limits  URINE CULTURE    EKG   Radiology No results found.  Procedures Procedures (including critical care time)  Medications Ordered in UC Medications - No data to display  Initial Impression / Assessment and Plan / UC Course  I have reviewed the triage vital signs and the nursing notes.  Pertinent labs & imaging results that were available during my care of the patient were reviewed by me and considered in my medical decision making (see chart for details).     Urinalysis is consistent with urinary tract infection.  She also has some cough and cold symptoms.  I told her that this was a virus.  We will treat with antibiotics,  culture the urine, and have her follow-up with her usual urologist or family medicine  doctor Final Clinical Impressions(s) / UC Diagnoses   Final diagnoses:  Suprapubic pain, acute  Viral upper respiratory tract infection  Lower urinary tract infectious disease     Discharge Instructions      Drink lots of water Take the antibiotic 2 x a day  the urine has been sent for culture May take over-the-counter cough and cold medicines as needed   ED Prescriptions     Medication Sig Dispense Auth. Provider   cephALEXin (KEFLEX) 500 MG capsule Take 1 capsule (500 mg total) by mouth 2 (two) times daily. 10 capsule Eustace Moore, MD      PDMP not reviewed this encounter.   Eustace Moore, MD 05/14/21 1316

## 2021-05-16 LAB — URINE CULTURE
MICRO NUMBER:: 12602792
SPECIMEN QUALITY:: ADEQUATE

## 2024-05-26 ENCOUNTER — Ambulatory Visit (INDEPENDENT_AMBULATORY_CARE_PROVIDER_SITE_OTHER): Admitting: Allergy

## 2024-05-26 ENCOUNTER — Encounter: Payer: Self-pay | Admitting: Allergy

## 2024-05-26 ENCOUNTER — Other Ambulatory Visit: Payer: Self-pay

## 2024-05-26 VITALS — BP 122/82 | HR 81 | Temp 98.5°F | Resp 16 | Ht 63.0 in | Wt 141.1 lb

## 2024-05-26 DIAGNOSIS — J3089 Other allergic rhinitis: Secondary | ICD-10-CM | POA: Diagnosis not present

## 2024-05-26 DIAGNOSIS — L509 Urticaria, unspecified: Secondary | ICD-10-CM

## 2024-05-26 DIAGNOSIS — Z886 Allergy status to analgesic agent status: Secondary | ICD-10-CM

## 2024-05-26 DIAGNOSIS — J4520 Mild intermittent asthma, uncomplicated: Secondary | ICD-10-CM

## 2024-05-26 MED ORDER — EPINEPHRINE 0.3 MG/0.3ML IJ SOAJ: 0.3000 mg | INTRAMUSCULAR | 1 refills | Status: AC | PRN

## 2024-05-26 MED ORDER — IPRATROPIUM BROMIDE 0.03 % NA SOLN: 1.0000 | Freq: Two times a day (BID) | NASAL | 5 refills | Status: AC | PRN

## 2024-05-26 MED ORDER — FAMOTIDINE 20 MG PO TABS
20.0000 mg | ORAL_TABLET | Freq: Two times a day (BID) | ORAL | 2 refills | Status: AC
Start: 2024-05-26 — End: ?

## 2024-05-26 NOTE — Progress Notes (Signed)
 New Patient Note  RE: Martha Young MRN: [id number] DOB: 06-Nov-1945 Date of Office Visit: 05/26/2024  Consult requested by: Lorence Donald HERO, PA Primary care provider: Lorence Donald HERO, PA  Chief Complaint: Establish Care (She states she has runny nose and post nasal drip at night.)  History of Present Illness: I had the pleasure of seeing Martha Young for initial evaluation at the [facility] on 05/26/2024. She is a 78 y.o. female, who is referred here by Lannie Gell, PA for the evaluation of allergic rhinitis.  Discussed the use of AI scribe software for clinical note transcription with the patient, who gave verbal consent to proceed.  She has experienced persistent sneezing and rhinorrhea since 2009, with symptoms occurring year-round. She does not experience nasal congestion, stuffiness, or itchy, watery eyes. Her olfactory function is intact, and she does not have headaches associated with these symptoms.  She has tried multiple medications including Xyzal, Zyrtec, Claritin, and store brands like Alevert without relief. Flonase nasal spray did not alleviate symptoms, and azelastine nasal spray caused gagging. Allergy testing in 2018 was performed in our clinic that was positive to mold, dust mites, cockroaches. She has not received allergy shots or consulted an ENT specialist.  She was diagnosed with seasonal asthma following an H1N1 infection in 2009, which led to bronchitis and wheezing. She no longer uses an inhaler as she has not experienced wheezing in several years.  She has cold urticaria, resulting in hives when exposed to cold environments, particularly affecting her face and legs. Cold foods and drinks cause lip swelling and throat burning.  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   She reports symptoms of sneezing, rhinorrhea, PND. Symptoms have been going on for  15+ years. The symptoms are present all year around. Other triggers include exposure to unknown. Anosmia: no. Headache: no. She has used xyzal, zyrtec, claritin, montelukast, Flonase with minimal improvement in symptoms. Sinus infections: no. Previous work up includes: 2018 intradermal testing was positive to mold, dust mites, cockroach. Previous ENT evaluation: yes - had some type of surgery on her vocal cords. History of reflux: egd showed reflux but no clinical symptoms.  Currently taking protonix.  Assessment and Plan: Martha Young is a 78 y.o. female with: Other allergic rhinitis Chronic allergic rhinitis with persistent symptoms for over 15 years, exacerbated in spring. Ineffective treatments with Xyzal, Zyrtec, Claritin, and Flonase. 2018 intradermal testing positive to mold, dust mites, cockroach. No prior AIT. Return for allergy skin testing. Will make additional recommendations based on results. Use Atrovent (ipratropium) 0.03% 1-2 sprays per nostril twice a day as needed for runny nose/drainage. Nasal saline spray (i.e., Simply Saline) or nasal saline lavage (i.e., NeilMed) is recommended as needed and prior to medicated nasal sprays.  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 EpiPen  expired. Continue strict avoidance. I have prescribed epinephrine  injectable device and demonstrated proper use. For mild symptoms you can take over the counter antihistamines (zyrtec 10mg  to 20mg ) and monitor symptoms closely.  If symptoms worsen or if you have severe symptoms including breathing issues, throat closure, significant swelling, whole body hives, severe diarrhea and vomiting, lightheadedness then use epinephrine  and seek immediate medical care afterwards. Emergency action plan given.  Urticaria Chronic cold-induced urticaria with worsening symptoms, including hives, facial and lip swelling, and throat burning after cold exposure. Limit  cold  exposure.  Keep track of rashes and take pictures. Continue proper skin care. Use fragrance free and dye free products. No dryer sheets or fabric softener.   Will get some labs after allergy testing.  Start zyrtec (cetirizine) 10mg  OR allegra (fexofenadine) 180mg  twice a day. Hold 3 days before skin testing.  If symptoms are not controlled or causes drowsiness let us  know. Start Pepcid (famotidine) 20mg  twice a day.  Hold 3 days before skin testing.  Avoid the following potential triggers: alcohol, tight clothing, NSAIDs, hot showers and getting overheated. Consider Xolair injections 300mg  every 4 weeks.  Reactive airway disease, mild intermittent, uncomplicated Resolved with no recent symptoms and inhaler use. Today's spirometry was normal. Monitor symptoms.  Return for Skin testing.  Meds ordered this encounter  Medications   EPINEPHrine  0.3 mg/0.3 mL IJ SOAJ injection    Sig: Inject 0.3 mg into the muscle as needed for anaphylaxis.    Dispense:  2 each    Refill:  1    May dispense generic/Mylan/Teva brand.   ipratropium (ATROVENT) 0.03 % nasal spray    Sig: Place 1-2 sprays into both nostrils 2 (two) times daily as needed (nasal drainage).    Dispense:  30 mL    Refill:  5   famotidine (PEPCID) 20 MG tablet    Sig: Take 1 tablet (20 mg total) by mouth 2 (two) times daily.    Dispense:  60 tablet    Refill:  2   Lab Orders  No laboratory test(s) ordered today    Other allergy screening: Asthma: yes No inhaler use for the past few years. Currently asymptomatic.  Food allergy: no Medication allergy: yes Hymenoptera allergy: no Urticaria: yes Induced by cold.  History of recurrent infections suggestive of immunodeficency: no  Diagnostics: Spirometry:  Tracings reviewed. Her effort: Good reproducible efforts. FVC: 2.96L FEV1: 2.28L, 116% predicted FEV1/FVC ratio: 77% Interpretation: Spirometry consistent with normal pattern.  Please see scanned  spirometry results for details.  Results discussed with patient/family.   Past Medical History: Patient Active Problem List   Diagnosis Date Noted   Urticaria due to cold 10/08/2016   Mild persistent asthma 10/08/2016   Perennial allergic rhinitis 10/08/2016   Allergic reaction 10/08/2016   Past Medical History:  Diagnosis Date   Diabetes (HCC)    Urticaria    Past Surgical History: Past Surgical History:  Procedure Laterality Date   ADENOIDECTOMY     CARPAL TUNNEL RELEASE Right    CATARACT EXTRACTION, BILATERAL     GALLBLADDER SURGERY     tubaligation     Medication List:  Current Outpatient Medications  Medication Sig Dispense Refill   calcium-vitamin D (OSCAL WITH D) 500-200 MG-UNIT TABS tablet Take 1 tablet by mouth daily.      cephALEXin  (KEFLEX ) 500 MG capsule Take 1 capsule (500 mg total) by mouth 2 (two) times daily. 10 capsule 0   Cholecalciferol (VITAMIN D3) 1000 units CAPS Take by mouth.     Coenzyme Q10 50 MG CAPS Take 50 mg by mouth.     EPINEPHrine  0.3 mg/0.3 mL IJ SOAJ injection Inject 0.3 mg into the muscle as needed for anaphylaxis. 2 each 1   Exenatide (BYDUREON [place]) Inject into the skin.     famotidine (PEPCID) 20 MG tablet Take 1 tablet (20 mg total) by mouth 2 (two) times daily. 60 tablet 2   ipratropium (ATROVENT) 0.03 % nasal spray Place 1-2 sprays into both nostrils 2 (two) times daily as needed (nasal drainage).  30 mL 5   levocetirizine (XYZAL) 5 MG tablet Take 5 mg by mouth every evening.     lisinopril (PRINIVIL,ZESTRIL) 10 MG tablet Take 10 mg by mouth daily.     Melatonin 10 MG CAPS Take 10 mg by mouth daily.     metFORMIN (GLUCOPHAGE-XR) 500 MG 24 hr tablet Take 2,000 mg by mouth daily.     montelukast (SINGULAIR) 10 MG tablet Take 10 mg by mouth daily.     Multiple Vitamin (THERA) TABS Take 1 tablet by mouth daily.     pravastatin (PRAVACHOL) 20 MG tablet Take 20 mg by mouth daily.     fluticasone (FLONASE) 50 MCG/ACT nasal spray Place 2  sprays into the nose daily. (Patient not taking: Reported on 05/26/2024)     ibandronate (BONIVA) 150 MG tablet Take 150 mg by mouth daily. (Patient not taking: Reported on 05/26/2024)     No current facility-administered medications for this visit.   Allergies: Allergies  Allergen Reactions   Aspirin Anaphylaxis    Passes out every time she takes it    Nsaids    Social History: Social History   Socioeconomic History   Marital status: Married    Spouse name: Not on file   Number of children: Not on file   Years of education: Not on file   Highest education level: Not on file  Occupational History   Not on file  Tobacco Use   Smoking status: Former    Types: Cigarettes   Smokeless tobacco: Never  Substance and Sexual Activity   Alcohol use: Yes   Drug use: No   Sexual activity: Not on file  Other Topics Concern   Not on file  Social History Narrative   Not on file   Social Drivers of Health   Financial Resource Strain: Low Risk  (10/24/2023)   Received from [facility]   Overall Financial Resource Strain (CARDIA)    Difficulty of Paying Living Expenses: Not hard at all  Food Insecurity: No Food Insecurity (10/24/2023)   Received from [facility]   Hunger Vital Sign    Within the past 12 months, you worried that your food would run out before you got the money to buy more.: Never true    Within the past 12 months, the food you bought just didn't last and you didn't have money to get more.: Never true  Transportation Needs: No Transportation Needs (10/24/2023)   Received from [facility] - Transportation    Lack of Transportation (Medical): No    Lack of Transportation (Non-Medical): No  Physical Activity: Unknown (07/21/2023)   Received from [facility]   Exercise Vital Sign    On average, how many days per week do you engage in moderate to strenuous exercise (like a brisk walk)?: 0 days    Minutes of Exercise per Session: Not on file  Stress:  Patient Declined (07/21/2023)   Received from [facility] - Occupational Stress Questionnaire    Feeling of Stress : Patient declined  Social Connections: Patient Declined (07/21/2023)   Received from [facility]   Social Network    How would you rate your social network (family, work, friends)?: Patient declined   Lives in a house. Smoking: quit in 2001 Occupation: retired  Landscape Architect History: Water Damage/mildew in the house: no Engineer, Civil (consulting) in the family room: yes Carpet in the bedroom: yes Heating: heat pump Cooling: heat pump Pet: yes 1  dog and 2 cats  Family History: Family History  Problem Relation Age of Onset   Allergic rhinitis Sister    Angioedema Neg Hx    Asthma Neg Hx    Eczema Neg Hx    Immunodeficiency Neg Hx    Urticaria Neg Hx    Review of Systems  Constitutional:  Negative for appetite change, chills, fever and unexpected weight change.  HENT:  Positive for congestion, postnasal drip, rhinorrhea and sneezing.   Eyes:  Negative for itching.  Respiratory:  Negative for cough, chest tightness, shortness of breath and wheezing.   Cardiovascular:  Negative for chest pain.  Gastrointestinal:  Negative for abdominal pain.  Genitourinary:  Negative for difficulty urinating.  Skin:  Positive for rash.  Neurological:  Negative for headaches.    Objective: BP 122/82 (BP Location: Left Arm, Patient Position: Sitting, Cuff Size: Normal)   Pulse 81   Temp 98.5 F (36.9 C) (Temporal)   Resp 16   Ht 5' 3 (1.6 m)   Wt 141 lb 1.6 oz (64 kg)   SpO2 97%   BMI 24.99 kg/m  Body mass index is 24.99 kg/m. Physical Exam Vitals and nursing note reviewed.  Constitutional:      Appearance: Normal appearance. She is well-developed.  HENT:     Head: Normocephalic and atraumatic.     Right Ear: Tympanic membrane and external ear normal.     Left Ear: Tympanic membrane and external ear normal.     Nose: Nose normal.      Mouth/Throat:     Mouth: Mucous membranes are moist.     Pharynx: Oropharynx is clear.  Eyes:     Conjunctiva/sclera: Conjunctivae normal.  Cardiovascular:     Rate and Rhythm: Normal rate and regular rhythm.     Heart sounds: Normal heart sounds. No murmur heard.    No friction rub. No gallop.  Pulmonary:     Effort: Pulmonary effort is normal.     Breath sounds: Normal breath sounds. No wheezing, rhonchi or rales.  Musculoskeletal:     Cervical back: Neck supple.  Skin:    General: Skin is warm.     Findings: No rash.  Neurological:     Mental Status: She is alert and oriented to person, place, and time.  Psychiatric:        Behavior: Behavior normal.    The plan was reviewed with the patient/family, and all questions/concerned were addressed.  It was my pleasure to see Martha Young today and participate in her care. Please feel free to contact me with any questions or concerns.  Sincerely,  Orlan Cramp, DO Allergy & Immunology  [facility]  [facility] office: [phone] [facility] office: [phone]

## 2024-05-26 NOTE — Patient Instructions (Addendum)
 Rhinitis  Return for allergy skin testing. Will make additional recommendations based on results. Make sure you don't take any antihistamines for 3 days before the skin testing appointment. Don't put any lotion on the back and arms on the day of testing.  Must be in good health and not ill. No vaccines/injections/antibiotics within the past 7 days.  Plan on being here for 30-60 minutes.  Use Atrovent (ipratropium) 0.03% 1-2 sprays per nostril twice a day as needed for runny nose/drainage. Nasal saline spray (i.e., Simply Saline) or nasal saline lavage (i.e., NeilMed) is recommended as needed and prior to medicated nasal sprays.  Breathing Monitor symptoms. Normal breathing test today.  Hives Limit cold exposure.  Keep track of rashes and take pictures. Continue proper skin care. Use fragrance free and dye free products. No dryer sheets or fabric softener.   Will get some labs after allergy testing.   Start zyrtec (cetirizine) 10mg  OR allegra (fexofenadine) 180mg  twice a day. Hold 3 days before skin testing.  If symptoms are not controlled or causes drowsiness let us  know. Start Pepcid (famotidine) 20mg  twice a day.  Hold 3 days before skin testing.  Avoid the following potential triggers: alcohol, tight clothing, NSAIDs, hot showers and getting overheated. Consider Xolair injections 300mg  every 4 weeks. Xolair. com  Nsaid allergy Continue strict avoidance. I have prescribed epinephrine  injectable device and demonstrated proper use. For mild symptoms you can take over the counter antihistamines (zyrtec 10mg  to 20mg ) and monitor symptoms closely.  If symptoms worsen or if you have severe symptoms including breathing issues, throat closure, significant swelling, whole body hives, severe diarrhea and vomiting, lightheadedness then use epinephrine  and seek immediate medical care afterwards. Emergency action plan given.  Follow up for skin testing.

## 2024-06-20 NOTE — Progress Notes (Unsigned)
 Skin testing note  RE: Martha Young MRN: [id number] DOB: 19-May-1946 Date of Office Visit: 06/21/2024  Referring provider: Lorence Donald HERO, PA Primary care provider: Valma Lannie LABOR, PA-C  Chief Complaint: skin testing  History of Present Illness: I had the pleasure of seeing Martha Young for a skin testing visit at the [facility] on 06/21/2024. She is a 78 y.o. female, who is being followed for allergic rhinitis, allergy  to NSAIDs, urticaria and reactive airway disease. Her previous allergy  office visit was on 04/25/2024 with Dr. Luke. Today is a skin testing visit.  She is accompanied today by her husband who provided/contributed to the history.   Discussed the use of AI scribe software for clinical note transcription with the patient, who gave verbal consent to proceed.    She experiences ongoing allergy  symptoms primarily in the spring, characterized by a runny nose. Allergy  intradermal testing in 2018 revealed positive results for mold, dust mites, and cockroach. She has not undergone blood work for allergies previously. Zyrtec and Pepcid  have been effective in managing her runny nose, but Allegra has not provided relief. No congestion, and the nasal spray is helping with her runny nose.  She experiences hives triggered by cold exposure. Despite taking Zyrtec and Pepcid , the cold-induced hives persist. She reports that the ice cube test previously caused a reaction, which led to the identification of cold-induced hives.      Assessment and Plan: [name] is a 78 y.o. female with: Other allergic rhinitis Past history - Chronic allergic rhinitis with persistent symptoms for over 15 years, exacerbated in spring. Ineffective treatments with Xyzal, Zyrtec, Claritin, and Flonase. 2018 intradermal testing positive to mold, dust mites, cockroach. No prior AIT. 06/21/2024 skin testing negative to indoor/outdoor allergens and common foods.  Use Atrovent  (ipratropium) 0.03%  1-2 sprays per nostril twice a day as needed for runny nose/drainage. Nasal saline spray (i.e., Simply Saline) or nasal saline lavage (i.e., NeilMed) is recommended as needed and prior to medicated nasal sprays. Get labs.    Urticaria Past history - Chronic cold-induced urticaria with worsening symptoms, including hives, facial and lip swelling, and throat burning after cold exposure. Limit cold exposure.  Keep track of rashes and take pictures. Continue proper skin care. Use fragrance free and dye free products. No dryer sheets or fabric softener.   Start zyrtec (cetirizine) 10mg  OR allegra (fexofenadine) 180mg  twice a day. If symptoms are not controlled or causes drowsiness let us  know. Start Pepcid  (famotidine ) 20mg  twice a day.  Avoid the following potential triggers: alcohol, tight clothing, NSAIDs, hot showers and getting overheated. Consider Xolair injections 300mg  every 4 weeks. Get labs.  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 EpiPen  expired. Continue strict avoidance. For mild symptoms you can take over the counter antihistamines (zyrtec 10mg  to 20mg ) and monitor symptoms closely.  If symptoms worsen or if you have severe symptoms including breathing issues, throat closure, significant swelling, whole body hives, severe diarrhea and vomiting, lightheadedness then use epinephrine  and seek immediate medical care afterwards. Emergency action plan in place.    Reactive airway disease, mild intermittent, uncomplicated Past history - Resolved with no recent symptoms and inhaler use. 2025 spirometry was normal. Monitor symptoms.   Return in about 4 weeks (around 07/19/2024).  No orders of the defined types were placed in this encounter.  Lab Orders         ANA, IFA (with reflex)  C3 and C4         C1 esterase inhibitor, functional         C1 Esterase Inhibitor         Chronic Urticaria (CU) Eval          Tryptase         Comprehensive metabolic panel with GFR         Allergens w/Total IgE- Respiratory-Area 2         Protein electrophoresis, serum         Protein Electrophoresis, Urine Rflx.      Diagnostics: Skin Testing: Environmental allergy  panel and select foods. 06/21/2024 skin testing negative to indoor/outdoor allergens and common foods.  Results discussed with patient/family.  Airborne Adult Perc - 06/21/24 0938     Time Antigen Placed 9061    Allergen Manufacturer Jestine    Location Back    Number of Test 55    1. Control-Buffer 50% Glycerol Negative    2. Control-Histamine 3+    3. Bahia Negative    4. Bermuda Negative    5. Johnson Negative    6. [place]  Blue Negative    7. Meadow Fescue Negative    8. Perennial Rye Negative    9. Timothy Negative    10. Ragweed Mix Negative    11. Cocklebur Negative    12. Plantain,  English Negative    13. Baccharis Negative    14. Dog Fennel Negative    15. Russian Thistle Negative    16. Lamb's Quarters Negative    17. Sheep Sorrell Negative    18. Rough Pigweed Negative    19. Marsh Elder, Rough Negative    20. Mugwort, Common Negative    21. Box, Elder Negative    22. Cedar, red Negative    23. Sweet Gum Negative    24. Pecan Pollen Negative    25. Pine Mix Negative    26. Walnut, Black Pollen Negative    27. Red Mulberry Negative    28. Ash Mix Negative    29. Birch Mix Negative    30. Beech American Negative    31. Cottonwood, Eastern Negative    32. Hickory, Raynes Negative    33. Maple Mix Negative    34. Oak, Eastern Mix Negative    35. Sycamore Eastern Negative    36. Alternaria Alternata Negative    37. Cladosporium Herbarum Negative    38. Aspergillus Mix Negative    39. Penicillium Mix Negative    40. Bipolaris Sorokiniana (Helminthosporium) Negative    41. Drechslera Spicifera (Curvularia) Negative    42. Mucor Plumbeus Negative    43. Fusarium Moniliforme Negative    44. Aureobasidium  Pullulans (pullulara) Negative    45. Rhizopus Oryzae Negative    46. Botrytis Cinera Negative    47. Epicoccum Nigrum Negative    48. Phoma Betae Negative    49. Dust Mite Mix Negative    50. Cat Hair 10,000 BAU/ml Negative    51.  Dog Epithelia Negative    52. Mixed Feathers Negative    53. Horse Epithelia Negative    54. Cockroach, German Negative    55. Tobacco Leaf Negative          Intradermal - 06/21/24 1022     Time Antigen Placed 1022    Allergen Manufacturer Greer    Location Arm    Number of Test 16    Control Negative    Bahia Negative  Bermuda Negative    Johnson Negative    7 Grass Negative    Ragweed Mix Negative    Weed Mix Negative    Tree Mix Negative    Mold 1 Negative    Mold 2 Negative    Mold 3 Negative    Mold 4 Negative    Mite Mix Negative    Cat Negative    Dog Negative    Cockroach Negative          Food Adult Perc - 06/21/24 0900     Time Antigen Placed 9060    Allergen Manufacturer Jestine    Location Back    Number of allergen test 13    Control-Histamine 3+    1. Peanut Negative    2. Soybean Negative    3. Wheat Negative    4. Sesame Negative    5. Milk, Cow Negative    6. Casein Negative    7. Egg Debski, Chicken Negative    8. Shellfish Mix Negative    9. Fish Mix Negative    10. Cashew Negative    11. Walnut Food Negative    12. Almond Negative    13. Hazelnut Negative          Previous notes and tests were reviewed. The plan was reviewed with the patient/family, and all questions/concerned were addressed.  It was my pleasure to see Yarel today and participate in her care. Please feel free to contact me with any questions or concerns.  Sincerely,  Orlan Cramp, DO Allergy  & Immunology  [facility]  [place] office: [phone] [facility] office: [phone]

## 2024-06-21 ENCOUNTER — Encounter: Payer: Self-pay | Admitting: Allergy

## 2024-06-21 ENCOUNTER — Ambulatory Visit: Admitting: Allergy

## 2024-06-21 DIAGNOSIS — J3089 Other allergic rhinitis: Secondary | ICD-10-CM

## 2024-06-21 DIAGNOSIS — L508 Other urticaria: Secondary | ICD-10-CM | POA: Diagnosis not present

## 2024-06-21 DIAGNOSIS — J4520 Mild intermittent asthma, uncomplicated: Secondary | ICD-10-CM

## 2024-06-21 DIAGNOSIS — T783XXD Angioneurotic edema, subsequent encounter: Secondary | ICD-10-CM

## 2024-06-21 DIAGNOSIS — Z886 Allergy status to analgesic agent status: Secondary | ICD-10-CM

## 2024-06-21 DIAGNOSIS — L509 Urticaria, unspecified: Secondary | ICD-10-CM

## 2024-06-21 NOTE — Patient Instructions (Addendum)
 06/21/2024 skin testing negative to indoor/outdoor allergens and common foods.   Results given.  Rhinitis  Use Atrovent  (ipratropium) 0.03% 1-2 sprays per nostril twice a day as needed for runny nose/drainage. Nasal saline spray (i.e., Simply Saline) or nasal saline lavage (i.e., NeilMed) is recommended as needed and prior to medicated nasal sprays. Get labs.   Breathing Monitor symptoms.  Hives Limit cold exposure.  Keep track of rashes and take pictures. Continue proper skin care. Use fragrance free and dye free products. No dryer sheets or fabric softener.   Start zyrtec (cetirizine) 10mg  OR allegra (fexofenadine) 180mg  twice a day. If symptoms are not controlled or causes drowsiness let us  know. Start Pepcid  (famotidine ) 20mg  twice a day.  Avoid the following potential triggers: alcohol, tight clothing, NSAIDs, hot showers and getting overheated. Consider Xolair injections 300mg  every 4 weeks. Xolair. com  Nsaid allergy  Continue strict avoidance. For mild symptoms you can take over the counter antihistamines (zyrtec 10mg  to 20mg ) and monitor symptoms closely.  If symptoms worsen or if you have severe symptoms including breathing issues, throat closure, significant swelling, whole body hives, severe diarrhea and vomiting, lightheadedness then use epinephrine  and seek immediate medical care afterwards. Emergency action plan in place.   Follow up in 4 weeks or sooner if needed.   Get bloodwork We are ordering labs, so please allow 1-2 weeks for the results to come back. With the newly implemented Cures Act, the labs might be visible to you at the same time that they become visible to me. However, I will not address the results until all of the results are back, so please be patient.  In the meantime, continue recommendations in your patient instructions, including avoidance measures (if applicable), until you hear from me.

## 2024-06-26 LAB — PROTEIN ELECTROPHORESIS, URINE REFLEX
Albumin ELP, Urine: 61 %
Alpha-1-Globulin, U: 1.4 %
Alpha-2-Globulin, U: 7.6 %
Beta Globulin, U: 16.5 %
Gamma Globulin, U: 13.5 %
Protein, Ur: 33.3 mg/dL

## 2024-06-26 LAB — ALLERGEN PROFILE WITH TOTAL IGE, RESPIRATORY-AREA 2
Alternaria Alternata IgE: <0.1 kU/L
Aspergillus Fumigatus IgE: <0.1 kU/L
Bermuda Grass IgE: 0.11 kU/L — AB
Cat Dander IgE: <0.1 kU/L
Cedar, Mountain IgE: <0.1 kU/L
Cladosporium Herbarum IgE: <0.1 kU/L
Cockroach, German IgE: 0.17 kU/L — AB
Common Silver Birch IgE: <0.1 kU/L
Cottonwood IgE: <0.1 kU/L
D Farinae IgE: 0.24 kU/L — AB
D Pteronyssinus IgE: 0.18 kU/L — AB
Dog Dander IgE: <0.1 kU/L
Elm, American IgE: <0.1 kU/L
IgE (Immunoglobulin E), Serum: 569 [IU]/mL — AB (ref 6–495)
Johnson Grass IgE: <0.1 kU/L
Maple/Box Elder IgE: <0.1 kU/L
Mouse Urine IgE: <0.1 kU/L
Oak, White IgE: <0.1 kU/L
Pecan, Hickory IgE: <0.1 kU/L
Penicillium Chrysogen IgE: <0.1 kU/L
Pigweed, Rough IgE: <0.1 kU/L
Ragweed, Short IgE: <0.1 kU/L
Sheep Sorrel IgE Qn: <0.1 kU/L
Timothy Grass IgE: <0.1 kU/L
White Mulberry IgE: <0.1 kU/L

## 2024-06-26 LAB — CHRONIC URTICARIA (CU) EVAL
ALT: 15 IU/L (ref 0–32)
Basophils Absolute: 0 x10E3/uL (ref 0.0–0.2)
Basos: 0 %
CRP: 6 mg/L (ref 0–10)
EOS (ABSOLUTE): 0.1 x10E3/uL (ref 0.0–0.4)
Eos: 1 %
Hematocrit: 44.4 % (ref 34.0–46.6)
Hemoglobin: 15.1 g/dL (ref 11.1–15.9)
Immature Grans (Abs): 0 x10E3/uL (ref 0.0–0.1)
Immature Granulocytes: 0 %
Lymphocytes Absolute: 3 x10E3/uL (ref 0.7–3.1)
Lymphs: 36 %
MCH: 31.7 pg (ref 26.6–33.0)
MCHC: 34 g/dL (ref 31.5–35.7)
MCV: 93 fL (ref 79–97)
Monocytes Absolute: 0.6 x10E3/uL (ref 0.1–0.9)
Monocytes: 7 %
Neutrophils Absolute: 4.6 x10E3/uL (ref 1.4–7.0)
Neutrophils: 56 %
Platelets: 151 x10E3/uL (ref 150–450)
Pooled Donor- BAT CU: 16.2 % — AB (ref 0.00–10.60)
RBC: 4.76 x10E6/uL (ref 3.77–5.28)
RDW: 12.5 % (ref 11.7–15.4)
Sed Rate: 12 mm/h (ref 0–40)
TSH: 1.1 u[IU]/mL (ref 0.450–4.500)
Thyroperoxidase Ab SerPl-aCnc: 10 [IU]/mL (ref 0–34)
WBC: 8.3 x10E3/uL (ref 3.4–10.8)

## 2024-06-26 LAB — COMPREHENSIVE METABOLIC PANEL WITH GFR
AST: 20 IU/L (ref 0–40)
Albumin: 4.5 g/dL (ref 3.8–4.8)
Alkaline Phosphatase: 60 IU/L (ref 49–135)
BUN/Creatinine Ratio: 24 (ref 12–28)
BUN: 22 mg/dL (ref 8–27)
Bilirubin Total: 1 mg/dL (ref 0.0–1.2)
CO2: 26 mmol/L (ref 20–29)
Calcium: 10.7 mg/dL — ABNORMAL HIGH (ref 8.7–10.3)
Chloride: 104 mmol/L (ref 96–106)
Creatinine, Ser: 0.93 mg/dL (ref 0.57–1.00)
Globulin, Total: 2.4 g/dL (ref 1.5–4.5)
Glucose: 94 mg/dL (ref 70–99)
Potassium: 4.3 mmol/L (ref 3.5–5.2)
Sodium: 144 mmol/L (ref 134–144)
Total Protein: 6.9 g/dL (ref 6.0–8.5)
eGFR: 64 mL/min/1.73 (ref >59)

## 2024-06-26 LAB — PROTEIN ELECTROPHORESIS, SERUM
A/G Ratio: 1.2 (ref 0.7–1.7)
Albumin ELP: 3.7 g/dL (ref 2.9–4.4)
Alpha 1: 0.3 g/dL (ref 0.0–0.4)
Alpha 2: 0.8 g/dL (ref 0.4–1.0)
Beta: 1.1 g/dL (ref 0.7–1.3)
Gamma Globulin: 1 g/dL (ref 0.4–1.8)
Globulin, Total: 3.2 g/dL (ref 2.2–3.9)

## 2024-06-26 LAB — ANTINUCLEAR ANTIBODIES, IFA: ANA Titer 1: NEGATIVE

## 2024-06-26 LAB — C3 AND C4
Complement C3, Serum: 142 mg/dL (ref 82–167)
Complement C4, Serum: 25 mg/dL (ref 12–38)

## 2024-06-26 LAB — C1 ESTERASE INHIBITOR: C1INH SerPl-mCnc: 29 mg/dL (ref 21–39)

## 2024-06-26 LAB — TRYPTASE: Tryptase: 11.7 ug/L (ref 2.2–13.2)

## 2024-06-26 LAB — C1 ESTERASE INHIBITOR, FUNCTIONAL: C1INH Functional/C1INH Total MFr SerPl: >105 %{normal}

## 2024-06-28 ENCOUNTER — Ambulatory Visit: Payer: Self-pay | Admitting: Allergy

## 2024-07-18 NOTE — Progress Notes (Unsigned)
 "  Follow Up Note  RE: Martha Young MRN: [id number] DOB: July 20, 1945 Date of Office Visit: 07/19/2024  Referring provider: Valma Lannie DELENA DEVONNA Primary care provider: Valma Lannie DELENA, PA-C  Chief Complaint: No chief complaint on file.  History of Present Illness: I had the pleasure of seeing Martha Young for a follow up visit at the [facility] on 07/19/2024. She is a 79 y.o. female, who is being followed for allergic rhinitis, urticaria, NSAID allergy  and reactive airway disease. Her previous allergy  office visit was on 06/21/2024 with Dr. Luke. Today is a regular follow up visit.  Discussed the use of AI scribe software for clinical note transcription with the patient, who gave verbal consent to proceed.  History of Present Illness            2025 labs: Blood count, kidney function, liver function, electrolytes, thyroid , autoimmune screener, inflammation markers, angioedema panel (checks for certain swelling issues), urine test and alpha gal (checks for red meat allergy ) were all normal which is great.    Your chronic urticaria lab was slightly elevated which means your body is making extra histamine. Most of the time there is no allergic trigger for this and we treat the symptoms with antihistamines.    Your environmental panel was borderline positive to dust mites, grass and cockroach.    Tryptase level was 11.7 which is in the normal range. This checks for how many mast cells you have in your body. Your levels were normal however sometimes when it is above 8, I offer patients a genetic test to check for hereditary alpha-tryptasemia. This does NOT have to be hereditary. It is done through [facility] and unfortunately it is an out of pocket expense of $169.  You can read more about it on their website. [email or web address]://www.[facility].[email or web address]/tryptase   We can also discuss further at your next visit if it would be a worthwhile test to order or not.    Start  zyrtec (cetirizine) 10mg  OR allegra (fexofenadine) 180mg  twice a day. If symptoms are not controlled or causes drowsiness let us  know. Start Pepcid  (famotidine ) 20mg  twice a day.  Assessment and Plan: Martha Young is a 80 y.o. female with: Other allergic rhinitis Past history - Chronic allergic rhinitis with persistent symptoms for over 15 years, exacerbated in spring. Ineffective treatments with Xyzal, Zyrtec, Claritin, and Flonase. 2018 intradermal testing positive to mold, dust mites, cockroach. No prior AIT. 06/21/2024 skin testing negative to indoor/outdoor allergens and common foods.  Use Atrovent  (ipratropium) 0.03% 1-2 sprays per nostril twice a day as needed for runny nose/drainage. Nasal saline spray (i.e., Simply Saline) or nasal saline lavage (i.e., NeilMed) is recommended as needed and prior to medicated nasal sprays. Get labs.    Urticaria Past history - Chronic cold-induced urticaria with worsening symptoms, including hives, facial and lip swelling, and throat burning after cold exposure. Limit cold exposure.  Keep track of rashes and take pictures. Continue proper skin care. Use fragrance free and dye free products. No dryer sheets or fabric softener.   Start zyrtec (cetirizine) 10mg  OR allegra (fexofenadine) 180mg  twice a day. If symptoms are not controlled or causes drowsiness let us  know. Start Pepcid  (famotidine ) 20mg  twice a day.  Avoid the following potential triggers: alcohol, tight clothing, NSAIDs, hot showers and getting overheated. Consider Xolair injections 300mg  every 4 weeks. Get labs.   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 EpiPen  expired. Continue  strict avoidance. For mild symptoms you can take over the counter antihistamines (zyrtec 10mg  to 20mg ) and monitor symptoms closely.  If symptoms worsen or if you have severe symptoms including breathing issues, throat closure, significant  swelling, whole body hives, severe diarrhea and vomiting, lightheadedness then use epinephrine  and seek immediate medical care afterwards. Emergency action plan in place.    Reactive airway disease, mild intermittent, uncomplicated Past history - Resolved with no recent symptoms and inhaler use. 2025 spirometry was normal. Monitor symptoms. Assessment and Plan              No follow-ups on file.  No orders of the defined types were placed in this encounter.  Lab Orders  No laboratory test(s) ordered today    Diagnostics: Spirometry:  Tracings reviewed. Her effort: {Blank single:19197::Good reproducible efforts.,It was hard to get consistent efforts and there is a question as to whether this reflects a maximal maneuver.,Poor effort, data can not be interpreted.} FVC: ***L FEV1: ***L, ***% predicted FEV1/FVC ratio: ***% Interpretation: {Blank single:19197::Spirometry consistent with mild obstructive disease,Spirometry consistent with moderate obstructive disease,Spirometry consistent with severe obstructive disease,Spirometry consistent with possible restrictive disease,Spirometry consistent with mixed obstructive and restrictive disease,Spirometry uninterpretable due to technique,Spirometry consistent with normal pattern,No overt abnormalities noted given today's efforts}.  Please see scanned spirometry results for details.  Skin Testing: {Blank single:19197::Select foods,Environmental allergy  panel,Environmental allergy  panel and select foods,Food allergy  panel,None,Deferred due to recent antihistamines use}. *** Results discussed with patient/family.   Medication List:  Current Outpatient Medications  Medication Sig Dispense Refill   calcium-vitamin D (OSCAL WITH D) 500-200 MG-UNIT TABS tablet Take 1 tablet by mouth daily.      cephALEXin  (KEFLEX ) 500 MG capsule Take 1 capsule (500 mg total) by mouth 2 (two) times daily. 10 capsule 0    Cholecalciferol (VITAMIN D3) 1000 units CAPS Take by mouth.     Coenzyme Q10 50 MG CAPS Take 50 mg by mouth.     EPINEPHrine  0.3 mg/0.3 mL IJ SOAJ injection Inject 0.3 mg into the muscle as needed for anaphylaxis. 2 each 1   Exenatide (BYDUREON [place]) Inject into the skin.     famotidine  (PEPCID ) 20 MG tablet Take 1 tablet (20 mg total) by mouth 2 (two) times daily. 60 tablet 2   fluticasone (FLONASE) 50 MCG/ACT nasal spray Place 2 sprays into the nose daily. (Patient not taking: Reported on 05/26/2024)     ibandronate (BONIVA) 150 MG tablet Take 150 mg by mouth daily. (Patient not taking: Reported on 05/26/2024)     ipratropium (ATROVENT ) 0.03 % nasal spray Place 1-2 sprays into both nostrils 2 (two) times daily as needed (nasal drainage). 30 mL 5   levocetirizine (XYZAL) 5 MG tablet Take 5 mg by mouth every evening.     lisinopril (PRINIVIL,ZESTRIL) 10 MG tablet Take 10 mg by mouth daily.     Melatonin 10 MG CAPS Take 10 mg by mouth daily.     metFORMIN (GLUCOPHAGE-XR) 500 MG 24 hr tablet Take 2,000 mg by mouth daily.     montelukast (SINGULAIR) 10 MG tablet Take 10 mg by mouth daily.     Multiple Vitamin (THERA) TABS Take 1 tablet by mouth daily.     pravastatin (PRAVACHOL) 20 MG tablet Take 20 mg by mouth daily.     No current facility-administered medications for this visit.   Allergies: Allergies[1] I reviewed her past medical history, social history, family history, and environmental history and no significant changes have been reported from her  previous visit.  Review of Systems  Constitutional:  Negative for appetite change, chills, fever and unexpected weight change.  HENT:  Positive for congestion, postnasal drip, rhinorrhea and sneezing.   Eyes:  Negative for itching.  Respiratory:  Negative for cough, chest tightness, shortness of breath and wheezing.   Cardiovascular:  Negative for chest pain.  Gastrointestinal:  Negative for abdominal pain.  Genitourinary:   Negative for difficulty urinating.  Skin:  Positive for rash.  Allergic/Immunologic: Positive for environmental allergies.  Neurological:  Negative for headaches.    Objective: There were no vitals taken for this visit. There is no height or weight on file to calculate BMI. Physical Exam Vitals and nursing note reviewed.  Constitutional:      Appearance: Normal appearance. She is well-developed.  HENT:     Head: Normocephalic and atraumatic.     Right Ear: Tympanic membrane and external ear normal.     Left Ear: Tympanic membrane and external ear normal.     Nose: Nose normal.     Mouth/Throat:     Mouth: Mucous membranes are moist.     Pharynx: Oropharynx is clear.  Eyes:     Conjunctiva/sclera: Conjunctivae normal.  Cardiovascular:     Rate and Rhythm: Normal rate and regular rhythm.     Heart sounds: Normal heart sounds. No murmur heard.    No friction rub. No gallop.  Pulmonary:     Effort: Pulmonary effort is normal.     Breath sounds: Normal breath sounds. No wheezing, rhonchi or rales.  Musculoskeletal:     Cervical back: Neck supple.  Skin:    General: Skin is warm.     Findings: No rash.  Neurological:     Mental Status: She is alert and oriented to person, place, and time.  Psychiatric:        Behavior: Behavior normal.   Previous notes and tests were reviewed. The plan was reviewed with the patient/family, and all questions/concerned were addressed.  It was my pleasure to see Martha Young today and participate in her care. Please feel free to contact me with any questions or concerns.  Sincerely,  Orlan Cramp, DO Allergy  & Immunology  [facility] office: [phone] [facility] office: [phone]     [1] Allergies Allergen Reactions   Aspirin Anaphylaxis    Passes out every time she takes it    Nsaids   "

## 2024-07-19 ENCOUNTER — Encounter: Payer: Self-pay | Admitting: Allergy

## 2024-07-19 ENCOUNTER — Other Ambulatory Visit: Payer: Self-pay

## 2024-07-19 ENCOUNTER — Ambulatory Visit: Admitting: Allergy

## 2024-07-19 VITALS — BP 120/66 | HR 92 | Temp 97.9°F | Resp 18 | Ht 62.5 in | Wt 143.1 lb

## 2024-07-19 DIAGNOSIS — J3089 Other allergic rhinitis: Secondary | ICD-10-CM | POA: Diagnosis not present

## 2024-07-19 DIAGNOSIS — Z886 Allergy status to analgesic agent status: Secondary | ICD-10-CM

## 2024-07-19 DIAGNOSIS — J4520 Mild intermittent asthma, uncomplicated: Secondary | ICD-10-CM | POA: Diagnosis not present

## 2024-07-19 DIAGNOSIS — L509 Urticaria, unspecified: Secondary | ICD-10-CM

## 2024-07-19 DIAGNOSIS — L508 Other urticaria: Secondary | ICD-10-CM | POA: Diagnosis not present

## 2024-07-19 MED ORDER — ALBUTEROL SULFATE HFA 108 (90 BASE) MCG/ACT IN AERS: 2.0000 | INHALATION_SPRAY | RESPIRATORY_TRACT | 1 refills | Status: AC | PRN

## 2024-07-19 NOTE — Patient Instructions (Addendum)
 Rhinitis  2025 labs borderline positive to dust mites, grass and cockroach.  See below for environmental control measures.  Use Atrovent  (ipratropium) 0.03% 1-2 sprays per nostril twice a day as needed for runny nose/drainage. Nasal saline spray (i.e., Simply Saline) or nasal saline lavage (i.e., NeilMed) is recommended as needed and prior to medicated nasal sprays.  Breathing May use albuterol  rescue inhaler 2 puffs every 4 to 6 hours as needed for shortness of breath, chest tightness, coughing, and wheezing. May use albuterol  rescue inhaler 2 puffs 5 to 15 minutes prior to strenuous physical activities. Monitor frequency of use - if you need to use it more than twice per week on a consistent basis let us  know.   Hives Limit cold exposure.  Keep track of rashes and take pictures. Continue proper skin care. Allegra (fexofenadine) 180mg  twice a day. If symptoms are not controlled or causes drowsiness let us  know. Pepcid  (famotidine ) 20mg  twice a day.  Continue Singulair (montelukast) 10mg  daily at night. Avoid the following potential triggers: alcohol, tight clothing, NSAIDs, hot showers and getting overheated. Start Xolair injections 300mg  every 4 weeks. PA started. Tammy will be in contact with you regarding coverage. Consent was signed.   Nsaid allergy  Continue strict avoidance. For mild symptoms you can take over the counter antihistamines (zyrtec 10mg  to 20mg ) and monitor symptoms closely.  If symptoms worsen or if you have severe symptoms including breathing issues, throat closure, significant swelling, whole body hives, severe diarrhea and vomiting, lightheadedness then use epinephrine  and seek immediate medical care afterwards. Emergency action plan in place.   Follow up in 2 months or sooner if needed.   Control of House Dust Mite Allergen Dust mite allergens are a common trigger of allergy  and asthma symptoms. While they can be found throughout the house, these microscopic  creatures thrive in warm, humid environments such as bedding, upholstered furniture and carpeting. Because so much time is spent in the bedroom, it is essential to reduce mite levels there.  Encase pillows, mattresses, and box springs in special allergen-proof fabric covers or airtight, zippered plastic covers.  Bedding should be washed weekly in hot water (130 F) and dried in a hot dryer. Allergen-proof covers are available for comforters and pillows that cant be regularly washed.  Wash the allergy -proof covers every few months. Minimize clutter in the bedroom. Keep pets out of the bedroom.  Keep humidity less than 50% by using a dehumidifier or air conditioning. You can buy a humidity measuring device called a hygrometer to monitor this.  If possible, replace carpets with hardwood, linoleum, or washable area rugs. If that's not possible, vacuum frequently with a vacuum that has a HEPA filter. Remove all upholstered furniture and non-washable window drapes from the bedroom. Remove all non-washable stuffed toys from the bedroom.  Wash stuffed toys weekly.  Reducing Pollen Exposure Pollen seasons: trees (spring), grass (summer) and ragweed/weeds (fall). Keep windows closed in your home and car to lower pollen exposure.  Install air conditioning in the bedroom and throughout the house if possible.  Avoid going out in dry windy days - especially early morning. Pollen counts are highest between 5 - 10 AM and on dry, hot and windy days.  Save outside activities for late afternoon or after a heavy rain, when pollen levels are lower.  Avoid mowing of grass if you have grass pollen allergy . Be aware that pollen can also be transported indoors on people and pets.  Dry your clothes in an automatic dryer rather  than hanging them outside where they might collect pollen.  Rinse hair and eyes before bedtime.  Cockroach Allergen Avoidance Cockroaches are often found in the homes of densely populated urban  areas, schools or commercial buildings, but these creatures can lurk almost anywhere. This does not mean that you have a dirty house or living area. Block all areas where roaches can enter the home. This includes crevices, wall cracks and windows.  Cockroaches need water to survive, so fix and seal all leaky faucets and pipes. Have an exterminator go through the house when your family and pets are gone to eliminate any remaining roaches. Keep food in lidded containers and put pet food dishes away after your pets are done eating. Vacuum and sweep the floor after meals, and take out garbage and recyclables. Use lidded garbage containers in the kitchen. Wash dishes immediately after use and clean under stoves, refrigerators or toasters where crumbs can accumulate. Wipe off the stove and other kitchen surfaces and cupboards regularly.

## 2024-07-26 ENCOUNTER — Telehealth: Payer: Self-pay | Admitting: *Deleted

## 2024-07-26 MED ORDER — XOLAIR 300 MG/2ML ~~LOC~~ SOSY: 300.0000 mg | PREFILLED_SYRINGE | SUBCUTANEOUS | 11 refills | Status: AC

## 2024-07-26 NOTE — Telephone Encounter (Signed)
 Called patient to discuss Xolair  she has tricare for pharmacy and will send Rx to [facility] the 1st 3 months are under medical tricare then goes under the pharmacy benefit. Will reach out to patient once delivery set to make appt to start therapy

## 2024-07-26 NOTE — Telephone Encounter (Signed)
-----   Message from Orlan Cramp, DO sent at 07/19/2024 12:13 PM EST ----- Please start PA for Xolair  300mg  every 4 weeks for hives. ty

## 2024-09-15 ENCOUNTER — Ambulatory Visit: Admitting: Allergy
